# Patient Record
Sex: Male | Born: 1975 | Race: White | Hispanic: No | Smoking: Never smoker
Health system: Southern US, Community
[De-identification: ages and names within clinical notes are randomized; demographics above are authoritative.]

## PROBLEM LIST (undated history)

## (undated) DIAGNOSIS — I639 Cerebral infarction, unspecified: Secondary | ICD-10-CM

## (undated) DIAGNOSIS — Z8489 Family history of other specified conditions: Secondary | ICD-10-CM

## (undated) DIAGNOSIS — Z87442 Personal history of urinary calculi: Secondary | ICD-10-CM

## (undated) DIAGNOSIS — M1990 Unspecified osteoarthritis, unspecified site: Secondary | ICD-10-CM

## (undated) DIAGNOSIS — U071 COVID-19: Secondary | ICD-10-CM

## (undated) DIAGNOSIS — E785 Hyperlipidemia, unspecified: Secondary | ICD-10-CM

## (undated) DIAGNOSIS — I493 Ventricular premature depolarization: Secondary | ICD-10-CM

## (undated) DIAGNOSIS — I10 Essential (primary) hypertension: Secondary | ICD-10-CM

## (undated) HISTORY — PX: OTHER SURGICAL HISTORY: SHX169

## (undated) HISTORY — PX: TONSILLECTOMY: SUR1361

## (undated) HISTORY — PX: HERNIA REPAIR: SHX51

---

## 1999-03-19 DIAGNOSIS — J930 Spontaneous tension pneumothorax: Secondary | ICD-10-CM

## 1999-03-19 HISTORY — DX: Spontaneous tension pneumothorax: J93.0

## 2004-11-08 ENCOUNTER — Emergency Department: Payer: Self-pay | Admitting: Emergency Medicine

## 2006-09-13 ENCOUNTER — Ambulatory Visit: Payer: Self-pay | Admitting: Emergency Medicine

## 2007-06-10 ENCOUNTER — Other Ambulatory Visit: Payer: Self-pay

## 2007-06-10 ENCOUNTER — Observation Stay: Payer: Self-pay | Admitting: Internal Medicine

## 2007-06-10 ENCOUNTER — Ambulatory Visit: Payer: Self-pay | Admitting: Family Medicine

## 2007-08-06 ENCOUNTER — Ambulatory Visit: Payer: Self-pay | Admitting: Internal Medicine

## 2007-08-25 ENCOUNTER — Ambulatory Visit: Payer: Self-pay | Admitting: Internal Medicine

## 2008-01-24 ENCOUNTER — Emergency Department: Payer: Self-pay | Admitting: Emergency Medicine

## 2010-05-02 ENCOUNTER — Ambulatory Visit: Payer: Self-pay | Admitting: Family Medicine

## 2010-05-23 ENCOUNTER — Other Ambulatory Visit: Payer: Self-pay | Admitting: Neurology

## 2010-05-23 DIAGNOSIS — H547 Unspecified visual loss: Secondary | ICD-10-CM

## 2010-05-24 ENCOUNTER — Ambulatory Visit (HOSPITAL_COMMUNITY)
Admission: RE | Admit: 2010-05-24 | Discharge: 2010-05-24 | Disposition: A | Discharge status: Home / Self Care | Payer: BC Managed Care – PPO | Source: Ambulatory Visit | Attending: Neurology | Admitting: Neurology

## 2010-05-24 DIAGNOSIS — H53129 Transient visual loss, unspecified eye: Secondary | ICD-10-CM | POA: Insufficient documentation

## 2010-05-24 DIAGNOSIS — I10 Essential (primary) hypertension: Secondary | ICD-10-CM | POA: Insufficient documentation

## 2010-05-24 DIAGNOSIS — I517 Cardiomegaly: Secondary | ICD-10-CM

## 2010-05-24 DIAGNOSIS — I6350 Cerebral infarction due to unspecified occlusion or stenosis of unspecified cerebral artery: Secondary | ICD-10-CM | POA: Insufficient documentation

## 2010-05-24 DIAGNOSIS — H547 Unspecified visual loss: Secondary | ICD-10-CM

## 2012-07-06 DIAGNOSIS — Z720 Tobacco use: Secondary | ICD-10-CM | POA: Insufficient documentation

## 2012-07-06 DIAGNOSIS — E7800 Pure hypercholesterolemia, unspecified: Secondary | ICD-10-CM | POA: Insufficient documentation

## 2012-07-06 DIAGNOSIS — I10 Essential (primary) hypertension: Secondary | ICD-10-CM | POA: Insufficient documentation

## 2012-11-30 DIAGNOSIS — Z0189 Encounter for other specified special examinations: Secondary | ICD-10-CM | POA: Insufficient documentation

## 2012-11-30 DIAGNOSIS — I639 Cerebral infarction, unspecified: Secondary | ICD-10-CM | POA: Insufficient documentation

## 2014-08-10 ENCOUNTER — Other Ambulatory Visit: Payer: Self-pay

## 2014-08-10 ENCOUNTER — Emergency Department: Payer: BLUE CROSS/BLUE SHIELD

## 2014-08-10 ENCOUNTER — Emergency Department
Admission: EM | Admit: 2014-08-10 | Discharge: 2014-08-10 | Disposition: A | Discharge status: Home / Self Care | Payer: BLUE CROSS/BLUE SHIELD | Attending: Emergency Medicine | Admitting: Emergency Medicine

## 2014-08-10 DIAGNOSIS — R51 Headache: Secondary | ICD-10-CM | POA: Diagnosis not present

## 2014-08-10 DIAGNOSIS — Z79899 Other long term (current) drug therapy: Secondary | ICD-10-CM | POA: Insufficient documentation

## 2014-08-10 DIAGNOSIS — R42 Dizziness and giddiness: Secondary | ICD-10-CM | POA: Diagnosis present

## 2014-08-10 DIAGNOSIS — Z7982 Long term (current) use of aspirin: Secondary | ICD-10-CM | POA: Diagnosis not present

## 2014-08-10 DIAGNOSIS — R270 Ataxia, unspecified: Secondary | ICD-10-CM | POA: Diagnosis not present

## 2014-08-10 LAB — TROPONIN I

## 2014-08-10 LAB — BASIC METABOLIC PANEL
ANION GAP: 8 (ref 5–15)
BUN: 12 mg/dL (ref 6–20)
CALCIUM: 9.2 mg/dL (ref 8.9–10.3)
CO2: 29 mmol/L (ref 22–32)
Chloride: 106 mmol/L (ref 101–111)
Creatinine, Ser: 0.82 mg/dL (ref 0.61–1.24)
GFR calc Af Amer: >60 mL/min (ref >60)
GFR calc non Af Amer: >60 mL/min (ref >60)
GLUCOSE: 112 mg/dL — AB (ref 65–99)
Potassium: 3.7 mmol/L (ref 3.5–5.1)
Sodium: 143 mmol/L (ref 135–145)

## 2014-08-10 LAB — CBC
HEMATOCRIT: 42.8 % (ref 40.0–52.0)
Hemoglobin: 14.3 g/dL (ref 13.0–18.0)
MCH: 28.5 pg (ref 26.0–34.0)
MCHC: 33.3 g/dL (ref 32.0–36.0)
MCV: 85.4 fL (ref 80.0–100.0)
PLATELETS: 306 10*3/uL (ref 150–440)
RBC: 5.01 MIL/uL (ref 4.40–5.90)
RDW: 13.7 % (ref 11.5–14.5)
WBC: 11.2 10*3/uL — ABNORMAL HIGH (ref 3.8–10.6)

## 2014-08-10 NOTE — ED Notes (Signed)
Pt reports feeling "off balance" since Sunday. Pt reports Sunday was the worst of the dizziness but over the past few days it has been constant. Pt reports a "burning" headache last night while at work.

## 2014-08-10 NOTE — ED Provider Notes (Signed)
[facility] Emergency Department Provider Note     Time seen: ----------------------------------------- 11:29 AM on 08/10/2014 -----------------------------------------    I have reviewed the triage vital signs and the nursing notes.   HISTORY  Chief Complaint Dizziness    HPI Jerome [name] is a 39 y.o. male who presents ER after being sent here by her clinic for dizziness. Clinical clinic reports his blood pressure is been elevated.Patient reports that Sunday he started noticing some balance difficulty. Review get up and walk and maybe take 1 step to the left. States when he is up and walking he feels a little off balance. It's not as bad as it was Sunday, he's not had a history of this before in terms of balance disturbance. He said a stroke in the past, had a transient headache today but is gone now. With his previous stroke he never had knee ataxia or or balance trouble. He denies any other complaints denies any recent illness. Symptoms are mild at this time.     No past medical history on file.  There are no active problems to display for this patient.   No past surgical history on file.  Current Outpatient Rx  Name  Route  Sig  Dispense  Refill  . aspirin EC 81 MG tablet   Oral   Take 81 mg by mouth daily. At lunchtime         . atorvastatin (LIPITOR) 20 MG tablet   Oral   Take 20 mg by mouth daily. At lunchtime         . lisinopril (PRINIVIL,ZESTRIL) 10 MG tablet   Oral   Take 10 mg by mouth daily. At lunchtime         . metoprolol succinate (TOPROL-XL) 100 MG 24 hr tablet   Oral   Take 100 mg by mouth daily. At lunchtime           Allergies Review of patient's allergies indicates no known allergies.  No family history on file.  Social History History  Substance Use Topics  . Smoking status: Not on file  . Smokeless tobacco: Not on file  . Alcohol Use: Not on file    Review of Systems Constitutional: Negative  for fever. Eyes: Negative for visual changes. ENT: Negative for sore throat. Cardiovascular: Negative for chest pain. Respiratory: Negative for shortness of breath. Gastrointestinal: Negative for abdominal pain, vomiting and diarrhea. Genitourinary: Negative for dysuria. Musculoskeletal: Negative for back pain. Skin: Negative for rash. Neurological: Positive for balance disturbance  10-point ROS otherwise negative.  ____________________________________________   PHYSICAL EXAM:  VITAL SIGNS: ED Triage Vitals  Enc Vitals Group     BP 08/10/14 1031 164/111 mmHg     Pulse Rate 08/10/14 1031 72     Resp 08/10/14 1031 22     Temp 08/10/14 1031 97.9 F (36.6 C)     Temp src --      SpO2 08/10/14 1031 99 %     Weight 08/10/14 1031 241 lb (109.317 kg)     Height 08/10/14 1031 6\' 1"  (1.854 m)     Head Cir --      Peak Flow --      Pain Score --      Pain Loc --      Pain Edu? --      Excl. in GC? --     Constitutional: Alert and oriented. Well appearing and in no distress. Eyes: Conjunctivae are normal. PERRL. Normal extraocular movements.  ENT   Head: Normocephalic and atraumatic.   Nose: No congestion/rhinnorhea.   Mouth/Throat: Mucous membranes are moist.   Neck: No stridor. Hematological/Lymphatic/Immunilogical: No cervical lymphadenopathy. Cardiovascular: Normal rate, regular rhythm. Normal and symmetric distal pulses are present in all extremities. No murmurs, rubs, or gallops. Respiratory: Normal respiratory effort without tachypnea nor retractions. Breath sounds are clear and equal bilaterally. No wheezes/rales/rhonchi. Gastrointestinal: Soft and nontender. No distention. No abdominal bruits. There is no CVA tenderness. Musculoskeletal: Nontender with normal range of motion in all extremities. No joint effusions.  No lower extremity tenderness nor edema. Neurologic:  There is mild gait instability and mild positive Romberg, patient thought he was falling  forward. No numbness, no weakness, no speech or language dysfunction. Skin:  Skin is warm, dry and intact. No rash noted. Psychiatric: Mood and affect are normal. Speech and behavior are normal. Patient exhibits appropriate insight and judgment.  ____________________________________________    LABS (pertinent positives/negatives)  Labs Reviewed  CBC - Abnormal; Notable for the following:    WBC 11.2 (*)    All other components within normal limits  BASIC METABOLIC PANEL - Abnormal; Notable for the following:    Glucose, Bld 112 (*)    All other components within normal limits  TROPONIN I    EKG: Interpreted by me., Normal sinus rhythm rate 70, normal axis normal intervals no evidence of hypertrophy or acute infarction.  ____________________________________________  ED COURSE:  Pertinent labs & imaging results that were available during my care of the patient were reviewed by me and considered in my medical decision making (see chart for details).  Patient will need a repeat CT head and basic lab work ____________________________________________   RADIOLOGY  CT head  FINDINGS: Ventricle size normal. Negative for acute infarct. Negative for hemorrhage or mass. Benign cyst right basal ganglia stable.  Calvarium intact.  IMPRESSION: Negative ____________________________________________    FINAL ASSESSMENT AND PLAN  Ataxia   Plan: Symptoms have resolved this time, discussed with neurology specifically Dr. Loretha Brasil who agreed patient can follow-up as an outpatient, for carotid Dopplers and possibly MRI. He'll continue on aspirin 81 mg and return for worsening or worrisome symptoms.    Emily Filbert, MD   Emily Filbert, MD 08/10/14 [phone]

## 2014-08-10 NOTE — ED Notes (Signed)
[facility] here because he was dizzy and his balance hasnt been right. [facility] reports that he has elevated B/P

## 2014-08-10 NOTE — Discharge Instructions (Signed)

## 2014-11-23 ENCOUNTER — Other Ambulatory Visit: Payer: Self-pay | Admitting: Family Medicine

## 2014-11-24 ENCOUNTER — Other Ambulatory Visit: Payer: Self-pay | Admitting: Family Medicine

## 2014-11-24 DIAGNOSIS — G118 Other hereditary ataxias: Secondary | ICD-10-CM

## 2014-11-24 DIAGNOSIS — R42 Dizziness and giddiness: Secondary | ICD-10-CM

## 2014-11-30 ENCOUNTER — Ambulatory Visit
Admission: RE | Admit: 2014-11-30 | Discharge: 2014-11-30 | Disposition: A | Discharge status: Home / Self Care | Payer: BLUE CROSS/BLUE SHIELD | Source: Ambulatory Visit | Attending: Family Medicine | Admitting: Family Medicine

## 2014-11-30 DIAGNOSIS — R42 Dizziness and giddiness: Secondary | ICD-10-CM | POA: Insufficient documentation

## 2014-11-30 DIAGNOSIS — G118 Other hereditary ataxias: Secondary | ICD-10-CM | POA: Insufficient documentation

## 2014-11-30 MED ORDER — GADOBENATE DIMEGLUMINE 529 MG/ML IV SOLN
20.0000 mL | Freq: Once | INTRAVENOUS | Status: AC | PRN
  Administered 2014-11-30: 20 mL via INTRAVENOUS

## 2015-01-31 ENCOUNTER — Ambulatory Visit (INDEPENDENT_AMBULATORY_CARE_PROVIDER_SITE_OTHER): Payer: BLUE CROSS/BLUE SHIELD | Admitting: Sports Medicine

## 2015-01-31 ENCOUNTER — Ambulatory Visit (INDEPENDENT_AMBULATORY_CARE_PROVIDER_SITE_OTHER): Payer: BLUE CROSS/BLUE SHIELD

## 2015-01-31 DIAGNOSIS — M722 Plantar fascial fibromatosis: Secondary | ICD-10-CM | POA: Diagnosis not present

## 2015-01-31 DIAGNOSIS — M79671 Pain in right foot: Secondary | ICD-10-CM

## 2015-01-31 MED ORDER — TRIAMCINOLONE ACETONIDE 10 MG/ML IJ SUSP: 10.0000 mg | Freq: Once | INTRAMUSCULAR | Status: DC

## 2015-01-31 MED ORDER — MELOXICAM 15 MG PO TABS
15.0000 mg | ORAL_TABLET | Freq: Every day | ORAL | Status: DC
Start: 2015-01-31 — End: 2018-02-25

## 2015-01-31 NOTE — Progress Notes (Deleted)
   Subjective:    Patient ID: Jerome [name], male    DOB: 06-22-1975, 39 y.o.   MRN: [id number]  HPI    Review of Systems  All other systems reviewed and are negative.      Objective:   Physical Exam        Assessment & Plan:

## 2015-01-31 NOTE — Patient Instructions (Signed)

## 2015-01-31 NOTE — Progress Notes (Signed)
Patient ID: Jerome [name], male   DOB: 09/02/1975, 39 y.o.   MRN: [id number] Subjective: Jerome [name] is a 39 y.o. male patient presents to office with complaint of heel pain on the right. 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 Patient has treated this problem with changing shoes. Patient reports that this treatment has not worked. Patient denies any other pedal complaints. Admits to plantar fasciitis previously on left heel that is not currently bothersome.  There are no active problems to display for this patient.  Current Outpatient Prescriptions on File Prior to Visit  Medication Sig Dispense Refill  . aspirin EC 81 MG tablet Take 81 mg by mouth daily. At lunchtime    . atorvastatin (LIPITOR) 20 MG tablet Take 20 mg by mouth daily. At lunchtime    . lisinopril (PRINIVIL,ZESTRIL) 10 MG tablet Take 10 mg by mouth daily. At lunchtime    . metoprolol succinate (TOPROL-XL) 100 MG 24 hr tablet Take 100 mg by mouth daily. At lunchtime     No current facility-administered medications on file prior to visit.   Allergies  Allergen Reactions  . Simvastatin Rash    Elevated CPK Other reaction(s): Muscle Pain Elevated CPK    Objective: Physical Exam General: The patient is alert and oriented x3 in no acute distress.  Dermatology: Skin is warm, dry and supple bilateral lower extremities. Nails 1-10 are normal. There is no erythema, edema, no eccymosis, no open lesions present. Integument is otherwise unremarkable.  Vascular: Dorsalis Pedis pulse and Posterior Tibial pulse are 2/4 bilateral. Capillary fill time is immediate to all digits.  Neurological: Grossly intact to light touch with an achilles reflex of +2/5 and a  negative Tinel's sign bilateral.  Musculoskeletal: Tenderness to palpation at the medial calcaneal tubercale and through the insertion of the plantar fascia on the right foot. No pain with compression of calcaneus bilateral. No pain with  tuning fork to calcaneus bilateral. No pain with calf compression bilateral. All pedal and ankle joints range of motion within normal limits bilateral. Strength 5/5 in all groups bilateral.   Gait: Unassisted, Antalgic avoid weight on right heel  Xray, Right foot: 3 Views Normal osseous mineralization. Joint spaces preserved. No fracture/dislocation/boney destruction. Calcaneal spur present with mild thickening of plantar fascia. No other soft tissue abnormalities or radiopaque foreign bodies.   Assessment and Plan: Problem List Items Addressed This Visit    None    Visit Diagnoses    Plantar fasciitis of right foot    -  Primary    Relevant Medications    triamcinolone acetonide (KENALOG) 10 MG/ML injection 10 mg    meloxicam (MOBIC) 15 MG tablet    Right foot pain        Relevant Medications    triamcinolone acetonide (KENALOG) 10 MG/ML injection 10 mg    meloxicam (MOBIC) 15 MG tablet    Other Relevant Orders    DG Foot Complete Right       -Complete examination performed. Discussed with patient in detail the condition of plantar fasciitis, how this occurs and general treatment options. Explained both conservative and surgical treatments.  -After oral consent and aseptic prep, injected a mixture containing 1 ml of 2%  plain lidocaine, 1 ml 0.5% plain marcaine, 0.5 ml of kenalog 10 and 0.5 ml of dexamethasone phosphate into right heel. Post-injection care discussed with patient.  -Rx Meloxicam -Recommended good supportive shoes and advised use of OTC insert. Explained to patient that  if these orthoses work well, we will continue with these. If these do not improve his condition and  pain, we will consider custom molded orthoses. - Explained in detail the use of the fascial brace which was dispensed at today's visit. -Patient already has a night splint; Instructed to start using it again on the right foot daily.  -Explained and dispensed to patient daily stretching  exercises. -Recommend patient to ice affected area 1-2x daily. -Patient to return to office in 3-4 weeks for follow up or sooner if problems or questions  Arise.  Asencion Islam, DPM

## 2015-02-21 ENCOUNTER — Ambulatory Visit (INDEPENDENT_AMBULATORY_CARE_PROVIDER_SITE_OTHER): Payer: BLUE CROSS/BLUE SHIELD | Admitting: Sports Medicine

## 2015-02-21 ENCOUNTER — Encounter: Payer: Self-pay | Admitting: Sports Medicine

## 2015-02-21 DIAGNOSIS — M722 Plantar fascial fibromatosis: Secondary | ICD-10-CM

## 2015-02-21 DIAGNOSIS — M79671 Pain in right foot: Secondary | ICD-10-CM

## 2015-02-21 NOTE — Progress Notes (Signed)
Patient ID: Jerome [name], male   DOB: 08/29/1975, [age] y.o.   MRN: [id number] Subjective: Jerome [name] is a 39 y.o. male returns to office for follow up evaluation after Right heel injection for plantar fasciitis, injection #1 administered 3 weeks ago. Patient states that the injection seems to help his pain; pain feels completely gone; stopped using brace and night splint 1 week ago. Patient able to tolerate meloxicam without adverse reaction. Patient denies any recent changes in medications or new problems since last visit.   There are no active problems to display for this patient.  Current Outpatient Prescriptions on File Prior to Visit  Medication Sig Dispense Refill  . aspirin EC 81 MG tablet Take 81 mg by mouth daily. At lunchtime    . atorvastatin (LIPITOR) 20 MG tablet Take 20 mg by mouth daily. At lunchtime    . fluticasone (FLONASE) 50 MCG/ACT nasal spray Place 2 sprays into both nostrils daily.    [name]. lisinopril (PRINIVIL,ZESTRIL) 10 MG tablet Take 10 mg by mouth daily. At lunchtime    . meloxicam (MOBIC) 15 MG tablet Take 1 tablet (15 mg total) by mouth daily. 30 tablet 0  . metoprolol succinate (TOPROL-XL) 100 MG 24 hr tablet Take 100 mg by mouth daily. At lunchtime     Current Facility-Administered Medications on File Prior to Visit  Medication Dose Route Frequency Provider Last Rate Last Dose  . triamcinolone acetonide (KENALOG) 10 MG/ML injection 10 mg  10 mg Other Once Asencion Islamitorya Zhane Donlan, DPM       Allergies  Allergen Reactions  . Simvastatin Rash    Elevated CPK Other reaction(s): Muscle Pain Elevated CPK   Objective:   General:  Alert and oriented x 3, in no acute distress  Dermatology: Skin is warm, dry, and supple bilateral. Nails are within normal limits. There is no lower extremity erythema, no eccymosis, no open lesions present bilateral.   Vascular: Dorsalis Pedis and Posterior Tibial pedal pulses are 2/4 bilateral. + hair growth noted bilateral. Capillary Fill  Time is 3 seconds in all digits. No varicosities, No edema bilateral lower extremities.   Neurological: Sensation grossly intact to light touch with an achilles reflex of +2 and a  negative Tinel's sign bilateral. Vibratory, sharp/dull, Semmes Weinstein Monofilament within normal limits.   Musculoskeletal: There is no tenderness to palpation at the medial calcaneal tubercale and through the insertion of the plantar fascia on the right foot. No pain with compression to calcaneus or application of tuning fork. All Joints range of motion within normal limits bilateral. Strength 5/5 bilateral.   Assessment and Plan: Problem List Items Addressed This Visit    None    Visit Diagnoses    Right foot pain    -  Primary    improved    Plantar fasciitis of right foot        improved      -Complete examination performed.  -Discussed with patient in detail the condition of plantar fasciitis, how this  occurs related to the foot type of the patient and general treatment options. - Advised patient to continue with night splint for a few more weeks to prevent reocurence -Advised patient to complete course of Meloxicam -Continue with stretching, icing, good supportive shoes, inserts daily.  -Patient to return to office in 1 month for complete d/c of care/ follow up or sooner if problems or questions arise. May consider orthotics for long term management.   Asencion Islamitorya Shauntel Prest, DPM

## 2015-02-24 ENCOUNTER — Ambulatory Visit: Payer: BLUE CROSS/BLUE SHIELD | Admitting: Sports Medicine

## 2015-03-23 DIAGNOSIS — R519 Headache, unspecified: Secondary | ICD-10-CM | POA: Insufficient documentation

## 2015-03-24 ENCOUNTER — Ambulatory Visit (INDEPENDENT_AMBULATORY_CARE_PROVIDER_SITE_OTHER): Payer: BLUE CROSS/BLUE SHIELD | Admitting: Sports Medicine

## 2015-03-24 ENCOUNTER — Encounter: Payer: Self-pay | Admitting: Sports Medicine

## 2015-03-24 DIAGNOSIS — M722 Plantar fascial fibromatosis: Secondary | ICD-10-CM | POA: Diagnosis not present

## 2015-03-24 DIAGNOSIS — L309 Dermatitis, unspecified: Secondary | ICD-10-CM | POA: Diagnosis not present

## 2015-03-24 DIAGNOSIS — M79671 Pain in right foot: Secondary | ICD-10-CM | POA: Diagnosis not present

## 2015-03-24 MED ORDER — HYDROCORTISONE 2.5 % EX CREA: TOPICAL_CREAM | Freq: Two times a day (BID) | CUTANEOUS | Status: DC

## 2015-03-24 NOTE — Progress Notes (Signed)
Patient ID: Jerome [name], male   DOB: [birth year], 40 y.o.   MRN: [id number]  Subjective: Jerome [name] is a 40 y.o. male returns to office for follow up evaluation after Right heel injection for plantar fasciitis, injection #1 administered about 7 weeks ago. Patient states that he has no recurrence of pain. 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 Patient denies any recent changes in medications or any other new problems since last visit.   There are no active problems to display for this patient.  Current Outpatient Prescriptions on File Prior to Visit  Medication Sig Dispense Refill  . aspirin EC 81 MG tablet Take 81 mg by mouth daily. At lunchtime    . atorvastatin (LIPITOR) 20 MG tablet Take 20 mg by mouth daily. At lunchtime    . fluticasone (FLONASE) 50 MCG/ACT nasal spray Place 2 sprays into both nostrils daily.    Jerome [name]. lisinopril (PRINIVIL,ZESTRIL) 10 MG tablet Take 10 mg by mouth daily. At lunchtime    . meloxicam (MOBIC) 15 MG tablet Take 1 tablet (15 mg total) by mouth daily. 30 tablet 0  . metoprolol succinate (TOPROL-XL) 100 MG 24 hr tablet Take 100 mg by mouth daily. At lunchtime     Current Facility-Administered Medications on File Prior to Visit  Medication Dose Route Frequency Provider Last Rate Last Dose  . triamcinolone acetonide (KENALOG) 10 MG/ML injection 10 mg  10 mg Other Once Jerome Islamitorya Kush Farabee, DPM       Allergies  Allergen Reactions  . Simvastatin Rash    Elevated CPK Other reaction(s): Muscle Pain Elevated CPK   Objective:   General:  Alert and oriented x 3, in no acute distress  Dermatology: Skin is warm, dry, and supple bilateral. Nails are within normal limits. There is no lower extremity  eccymosis, no open lesions present bilateral however to dorsum of both feet there is a purritic  erythematous patch of skin consistent with dermatitis with no acute infection.  Vascular: Dorsalis Pedis and Posterior Tibial pedal pulses are 2/4 bilateral. + hair growth noted bilateral. Capillary Fill Time is 3 seconds in all digits. No varicosities, No edema bilateral lower extremities.   Neurological: Sensation grossly intact to light touch with an achilles reflex of +2 and a  negative Tinel's sign bilateral. Vibratory, sharp/dull, Semmes Weinstein Monofilament within normal limits.   Musculoskeletal: There is no tenderness to palpation at the medial calcaneal tubercale and through the insertion of the plantar fascia on the right foot. No pain with compression to calcaneus or application of tuning fork. All Joints range of motion within normal limits bilateral. Strength 5/5 bilateral.   Assessment and Plan: Problem List Items Addressed This Visit    None    Visit Diagnoses    Right foot pain    -  Primary    Improved    Plantar fasciitis of right foot        Resolved    Dermatitis        dorsum bil foot      -Complete examination performed.  -Rx hydrocortisone cream for dermatitis  -Discussed long term care and prevention for Plantar fasciitis. Continue with stretching, icing, good supportive shoes,OTC inserts daily. Will scan for custom orthotics at follow up visit to prevent recurrence.  -Patient to return to  office in 3-4 weeks for follow up or sooner if problems or questions arise.  Jerome [name], DPM

## 2015-04-21 ENCOUNTER — Ambulatory Visit: Payer: BLUE CROSS/BLUE SHIELD | Admitting: Sports Medicine

## 2017-06-13 DIAGNOSIS — E669 Obesity, unspecified: Secondary | ICD-10-CM | POA: Insufficient documentation

## 2017-06-13 DIAGNOSIS — M51369 Other intervertebral disc degeneration, lumbar region without mention of lumbar back pain or lower extremity pain: Secondary | ICD-10-CM | POA: Insufficient documentation

## 2017-06-13 DIAGNOSIS — M5136 Other intervertebral disc degeneration, lumbar region: Secondary | ICD-10-CM | POA: Insufficient documentation

## 2018-02-25 ENCOUNTER — Other Ambulatory Visit: Payer: Self-pay

## 2018-02-25 ENCOUNTER — Ambulatory Visit
Admission: EM | Admit: 2018-02-25 | Discharge: 2018-02-25 | Disposition: A | Discharge status: Home / Self Care | Payer: BLUE CROSS/BLUE SHIELD | Attending: Family Medicine | Admitting: Family Medicine

## 2018-02-25 ENCOUNTER — Encounter: Payer: Self-pay | Admitting: Emergency Medicine

## 2018-02-25 DIAGNOSIS — G8929 Other chronic pain: Secondary | ICD-10-CM

## 2018-02-25 DIAGNOSIS — M5441 Lumbago with sciatica, right side: Secondary | ICD-10-CM | POA: Diagnosis not present

## 2018-02-25 HISTORY — DX: Hyperlipidemia, unspecified: E78.5

## 2018-02-25 HISTORY — DX: Essential (primary) hypertension: I10

## 2018-02-25 MED ORDER — PREDNISONE 10 MG PO TABS: ORAL_TABLET | ORAL | 0 refills | Status: DC

## 2018-02-25 MED ORDER — METAXALONE 800 MG PO TABS: 800.0000 mg | ORAL_TABLET | Freq: Three times a day (TID) | ORAL | 0 refills | Status: DC | PRN

## 2018-02-25 NOTE — ED Provider Notes (Signed)
[facility]    CSN: [id number] Arrival date & time: 02/25/18  0843  History   Chief Complaint Chief Complaint  Patient presents with  . Back Pain    right side   HPI  42 year old male with chronic back pain presents with back pain.  Longstanding history of back pain.  Has lumbar degenerative disc.  Patient states that he had an acute exacerbation of his back pain started on Friday.  Occurred after he sneezed.  He has been seeing a chiropractor without resolution pain is located in the right low back.  Reports pain and numbness down his right lower extremity.  No incontinence.  Frustrated by activity.  No other factors.  No other associated symptoms.  PMH, Surgical Hx, Family Hx, Social History reviewed and updated as below.  PMH: HTN, Headache, DDD (lumbar), Obesity, HLD  Surgical Hx: TONSILLECTOMY      pneumothorax with chest tube 2001 2001  Spontaneous   REPAIR UMBILICAL HERNIA 08/22/2015 Abdomen/N/A Procedure: REPAIR UMBILICAL HERNIA, AGE 63 YEARS OR OLDER; REDUCIBLE; POSSIBLE MESH PLACEMENT; Surgeon: Satira AnisNoran Maged Barry, MD; Location: [facility] OR; Service: General Surgery; Laterality: N/A; Umbilical Hernia     Home Medications    Prior to Admission medications   Medication Sig Start Date End Date Taking? Authorizing Provider  amLODipine (NORVASC) 10 MG tablet Take by mouth. 10/28/17 10/28/18 Yes [provider]  aspirin EC 81 MG tablet Take 81 mg by mouth daily. At lunchtime   Yes [provider]  atorvastatin (LIPITOR) 20 MG tablet Take 20 mg by mouth daily. At lunchtime   Yes [provider]  ezetimibe (ZETIA) 10 MG tablet Take by mouth. 10/28/17  Yes [provider]  lisinopril-hydrochlorothiazide (PRINZIDE,ZESTORETIC) 20-12.5 MG tablet Take by mouth. 11/04/17 11/04/18 Yes [provider]  metoprolol succinate (TOPROL-XL) 100 MG 24 hr tablet Take 100 mg by mouth daily. At lunchtime   Yes [provider]    metaxalone (SKELAXIN) 800 MG tablet Take 1 tablet (800 mg total) by mouth 3 (three) times daily as needed. 02/25/18   Tommie Samsook, Leisl Spurrier G, DO  predniSONE (DELTASONE) 10 MG tablet 50 mg daily x 2 days, then 40 mg daily x 2 days, then 30 mg daily x 2 days, then 20 mg daily x 2 days, then 10 mg daily x 2 days. 02/25/18   Tommie Samsook, Joslyn Ramos G, DO    Family History Medical History Relation Name Comments  Asthma Brother    Coronary Artery Disease (Blocked arteries around heart) Father  Stents  High blood pressure (Hypertension) Father    Coronary Artery Disease (Blocked arteries around heart) Mother  CABG  High blood pressure (Hypertension) Mother    Asthma Son    Cancer Neg Hx     Social History Social History   Tobacco Use  . Smoking status: Never Smoker  . Smokeless tobacco: Never Used  Substance Use Topics  . Alcohol use: Yes    Alcohol/week: 0.0 standard drinks  . Drug use: Never     Allergies   Simvastatin   Review of Systems Review of Systems  Genitourinary: Negative.   Musculoskeletal: Positive for back pain.   Physical Exam Triage Vital Signs ED Triage Vitals  Enc Vitals Group     BP 02/25/18 0857 (S) (!) 177/102     Pulse Rate 02/25/18 0857 70     Resp 02/25/18 0857 16     Temp 02/25/18 0857 97.8 F (36.6 C)     Temp Source 02/25/18  0857 Oral     SpO2 02/25/18 0857 99 %     Weight 02/25/18 0854 255 lb (115.7 kg)     Height 02/25/18 0854 6\' 1"  (1.854 m)     Head Circumference --      Peak Flow --      Pain Score 02/25/18 0853 9     Pain Loc --      Pain Edu? --      Excl. in GC? --    Updated Vital Signs BP (S) (!) 177/102 (BP Location: Right Arm)   Pulse 70   Temp 97.8 F (36.6 C) (Oral)   Resp 16   Ht 6\' 1"  (1.854 m)   Wt 115.7 kg   SpO2 99%   BMI 33.64 kg/m   Visual Acuity Right Eye Distance:   Left Eye Distance:   Bilateral Distance:    Right Eye Near:   Left Eye Near:    Bilateral Near:     Physical Exam  Constitutional: He is  oriented to person, place, and time. He appears well-developed. No distress.  HENT:  Head: Normocephalic and atraumatic.  Eyes: Conjunctivae are normal. Right eye exhibits no discharge. Left eye exhibits no discharge.  Cardiovascular: Normal rate and regular rhythm.  Pulmonary/Chest: Effort normal. No respiratory distress.  Musculoskeletal:  Lumbar spine - paraspinal muscle tension/spasm noted on the right.   Neurological: He is alert and oriented to person, place, and time.  Psychiatric: He has a normal mood and affect. His behavior is normal.  Nursing note and vitals reviewed.  UC Treatments / Results  Labs (all labs ordered are listed, but only abnormal results are displayed) Labs Reviewed - No data to display  EKG None  Radiology No results found.  Procedures Procedures (including critical care time)  Medications Ordered in UC Medications - No data to display  Initial Impression / Assessment and Plan / UC Course  I have reviewed the triage vital signs and the nursing notes.  Pertinent labs & imaging results that were available during my care of the patient were reviewed by me and considered in my medical decision making (see chart for details).    42 year old male presents with acute on chronic back pain.  Treating with prednisone and Skelaxin.  Final Clinical Impressions(s) / UC Diagnoses   Final diagnoses:  Right-sided low back pain with right-sided sciatica, unspecified chronicity     Discharge Instructions     Medication as prescribed.  Take care  Dr. Adriana Simas    ED Prescriptions    Medication Sig Dispense Auth. Provider   predniSONE (DELTASONE) 10 MG tablet 50 mg daily x 2 days, then 40 mg daily x 2 days, then 30 mg daily x 2 days, then 20 mg daily x 2 days, then 10 mg daily x 2 days. 30 tablet Lailana Shira G, DO   metaxalone (SKELAXIN) 800 MG tablet Take 1 tablet (800 mg total) by mouth 3 (three) times daily as needed. 30 tablet Tommie Sams, DO      Controlled Substance Prescriptions [place] Controlled Substance Registry consulted? Not Applicable   Tommie Sams, [place] 02/25/18 1610

## 2018-02-25 NOTE — Discharge Instructions (Signed)
Medication as prescribed.  Take care  Dr. Madalena Kesecker  

## 2018-02-25 NOTE — ED Triage Notes (Signed)
Patient c/o right sided lower back pain that started Friday after he sneezed.  Patient states that he saw his chiropracter yesterday and has had no relief.

## 2018-03-02 ENCOUNTER — Other Ambulatory Visit: Payer: Self-pay

## 2018-03-02 ENCOUNTER — Encounter: Payer: Self-pay | Admitting: Emergency Medicine

## 2018-03-02 ENCOUNTER — Emergency Department
Admission: EM | Admit: 2018-03-02 | Discharge: 2018-03-02 | Disposition: A | Discharge status: Home / Self Care | Payer: BLUE CROSS/BLUE SHIELD | Attending: Emergency Medicine | Admitting: Emergency Medicine

## 2018-03-02 DIAGNOSIS — Z7982 Long term (current) use of aspirin: Secondary | ICD-10-CM | POA: Diagnosis not present

## 2018-03-02 DIAGNOSIS — I10 Essential (primary) hypertension: Secondary | ICD-10-CM | POA: Insufficient documentation

## 2018-03-02 DIAGNOSIS — M545 Low back pain: Secondary | ICD-10-CM | POA: Diagnosis present

## 2018-03-02 DIAGNOSIS — M5417 Radiculopathy, lumbosacral region: Secondary | ICD-10-CM | POA: Insufficient documentation

## 2018-03-02 DIAGNOSIS — Z79899 Other long term (current) drug therapy: Secondary | ICD-10-CM | POA: Diagnosis not present

## 2018-03-02 MED ORDER — HYDROCODONE-ACETAMINOPHEN 5-325 MG PO TABS: 1.0000 | ORAL_TABLET | Freq: Three times a day (TID) | ORAL | 0 refills | Status: AC | PRN

## 2018-03-02 MED ORDER — ORPHENADRINE CITRATE ER 100 MG PO TB12: 100.0000 mg | ORAL_TABLET | Freq: Two times a day (BID) | ORAL | 0 refills | Status: DC | PRN

## 2018-03-02 MED ORDER — KETOROLAC TROMETHAMINE 30 MG/ML IJ SOLN
60.0000 mg | Freq: Once | INTRAMUSCULAR | Status: AC
  Administered 2018-03-02: 60 mg via INTRAMUSCULAR
  Filled 2018-03-02: qty 2

## 2018-03-02 MED ORDER — ORPHENADRINE CITRATE 30 MG/ML IJ SOLN
60.0000 mg | INTRAMUSCULAR | Status: AC
  Administered 2018-03-02: 60 mg via INTRAMUSCULAR
  Filled 2018-03-02: qty 2

## 2018-03-02 NOTE — ED Provider Notes (Signed)
[name] Emergency Department Provider Note ____________________________________________  Time seen: 1040  I have reviewed the triage vital signs and the nursing notes.  HISTORY  Chief Complaint  Back Pain  HPI Ernestina PatchesKevin R [name] is a 42 y.o. male presents to the ED accompanied by his mother, for evaluation of acute on chronic low back pain.  Patient with a history of DDD presents now with acute pain is been present for about 2 weeks now.  Patient has been seen recently by the [facility] walk-in clinic, and was started on Skelaxin and prednisone.  He is on about day 6 of a 10-day prednisone taper.  He denies any significant benefit with the medications.  He is scheduled to see his previous Ortho provider, Dedra Skeensodd Mundy, PA-C on Wednesday.  He denies any bladder or bowel incontinence, foot drop, or saddle anesthesias.  He denies any preceding injury by slip, trip, or fall.  The onset of his pain began after a violent sneeze nearly 2 weeks earlier.  Past Medical History:  Diagnosis Date  . Hyperlipidemia   . Hypertension     There are no active problems to display for this patient.  History reviewed. No pertinent surgical history.  Prior to Admission medications   Medication Sig Start Date End Date Taking? Authorizing Provider  amLODipine (NORVASC) 10 MG tablet Take by mouth. 10/28/17 10/28/18  [provider]  aspirin EC 81 MG tablet Take 81 mg by mouth daily. At lunchtime    [provider]  atorvastatin (LIPITOR) 20 MG tablet Take 20 mg by mouth daily. At lunchtime    [provider]  ezetimibe (ZETIA) 10 MG tablet Take by mouth. 10/28/17   [provider]  HYDROcodone-acetaminophen (NORCO) 5-325 MG tablet Take 1 tablet by mouth 3 (three) times daily as needed for up to 3 days. 03/02/18 03/05/18  Markia Kyer, Charlesetta IvoryJenise V Bacon, PA-C  lisinopril-hydrochlorothiazide (PRINZIDE,ZESTORETIC) 20-12.5 MG tablet Take by mouth. 11/04/17 11/04/18  [provider]  metaxalone (SKELAXIN) 800 MG tablet Take 1 tablet (800 mg total) by mouth 3 (three) times daily as needed. 02/25/18   Tommie Samsook, Jayce G, DO  metoprolol succinate (TOPROL-XL) 100 MG 24 hr tablet Take 100 mg by mouth daily. At lunchtime    [provider]  orphenadrine (NORFLEX) 100 MG tablet Take 1 tablet (100 mg total) by mouth 2 (two) times daily as needed for muscle spasms. 03/02/18   Tyray Proch, Charlesetta IvoryJenise V Bacon, PA-C  predniSONE (DELTASONE) 10 MG tablet 50 mg daily x 2 days, then 40 mg daily x 2 days, then 30 mg daily x 2 days, then 20 mg daily x 2 days, then 10 mg daily x 2 days. 02/25/18   Tommie Samsook, Jayce G, DO    Allergies Simvastatin  Family History  Problem Relation Age of Onset  . Hypertension Mother   . Hypertension Father     Social History Social History   Tobacco Use  . Smoking status: Never Smoker  . Smokeless tobacco: Never Used  Substance Use Topics  . Alcohol use: Yes    Alcohol/week: 0.0 standard drinks  . Drug use: Never    Review of Systems  Constitutional: Negative for fever. Cardiovascular: Negative for chest pain. Respiratory: Negative for shortness of breath. Gastrointestinal: Negative for abdominal pain, vomiting and diarrhea. Genitourinary: Negative for dysuria. Musculoskeletal: Positive for back pain. Skin: Negative for rash. Neurological: Negative for headaches, focal weakness or numbness. ____________________________________________  PHYSICAL EXAM:  VITAL SIGNS: ED Triage Vitals  Enc Vitals  Group     BP 03/02/18 0952 (!) 191/103     Pulse Rate 03/02/18 0952 86     Resp 03/02/18 0952 16     Temp 03/02/18 0952 (!) 97.5 F (36.4 C)     Temp src --      SpO2 03/02/18 0952 100 %     Weight 03/02/18 0948 254 lb 13.6 oz (115.6 kg)     Height 03/02/18 0948 6\' 1"  (1.854 m)     Head Circumference --      Peak Flow --      Pain Score 03/02/18 0948 10     Pain Loc --      Pain Edu? --      Excl. in GC? --     Constitutional:  Alert and oriented. Well appearing and in no distress. Head: Normocephalic and atraumatic. Eyes: Conjunctivae are normal. Normal extraocular movements Cardiovascular: Normal rate, regular rhythm. Normal distal pulses. Respiratory: Normal respiratory effort. No wheezes/rales/rhonchi. Gastrointestinal: Soft and nontender. No distention. Musculoskeletal: Normal spinal alignment without midline tenderness, spasm, deformity, or step-off.  Patient with normal transition from sit to stand.  He is able demonstrate normal lumbar extension but decreased flexion range secondary to pain.  He localized tenderness to palpation to the right SI joint region.  There is also some discomfort to the distal calf.  Normal hip, knee, and ankle exam on the right.  Nontender with normal range of motion in all extremities.  Neurologic: Cranial nerves II through XII grossly intact.  Normal LE DTRs bilaterally.  Negative supine straight leg raise bilaterally.  Negative Trendelenburg.  Normal gait without ataxia. Normal speech and language. No gross focal neurologic deficits are appreciated. Skin:  Skin is warm, dry and intact. No rash noted. ____________________________________________   RADIOLOGY  Deferred ____________________________________________  PROCEDURES  Procedures Toradol 60 mg IM Norflex 60 mg IM ____________________________________________  INITIAL IMPRESSION / ASSESSMENT AND PLAN / ED COURSE  Patient with ED evaluation of acute on chronic low back pain.  Patient symptoms are assistant with an acute L5-S1 radiculopathy.  Patient reports significant improvement of his symptoms at the time of this disposition.  He rates the pain at a 4 out of 10 which is improved from a 10 out of 10 at onset.  He will be discharged with prescriptions for Norflex to take instead of the previously written Skelaxin.  He will continue with a prednisone taper, and follow-up with Dedra Skeens on Wednesday.  Patient is agreeable with  the plan and we deferred any imaging at this time to his Ortho provider.  Work note provided for 2 days as requested.  I reviewed the patient's prescription history over the last 12 months in the multi-state controlled substances database(s) that includes [place], [place], [place], [place], [place], [place], [place], [place], [place], [place], [place], [place], [place], and [place].  Results were notable for no current prescriptions. ____________________________________________  FINAL CLINICAL IMPRESSION(S) / ED DIAGNOSES  Final diagnoses:  Lumbosacral radiculopathy      Karmen Stabs, Charlesetta Ivory, PA-C 03/02/18 1807    Nita Sickle, MD 03/05/18 [phone]

## 2018-03-02 NOTE — ED Notes (Signed)
See triage note  Presents with pain to lower back which is moving into right leg.  States pain started after sneezing 1 1/2 weeks ago   States he has been seen by chiropractor and urgent care   Is currently taking steroids   Denies any urinary issues

## 2018-03-02 NOTE — ED Triage Notes (Signed)
Low back pain with leg numbness.  Unable to sit, so he does not want a wheelchair.

## 2018-03-02 NOTE — ED Triage Notes (Signed)
Right leg numbness.  Like knife in low back/hip

## 2018-03-02 NOTE — Discharge Instructions (Addendum)
Your symptoms are consistent with radicular pain from your lumbar spine. Take the previous steroid as prescribed. Take the muscle relaxant and pain medicine as needed. Follow-up with Jerome [name], as planned.

## 2018-03-04 ENCOUNTER — Other Ambulatory Visit: Payer: Self-pay | Admitting: Orthopedic Surgery

## 2018-03-04 DIAGNOSIS — M5417 Radiculopathy, lumbosacral region: Secondary | ICD-10-CM

## 2018-03-04 DIAGNOSIS — G8929 Other chronic pain: Secondary | ICD-10-CM

## 2018-03-04 DIAGNOSIS — M545 Low back pain, unspecified: Secondary | ICD-10-CM

## 2018-03-04 DIAGNOSIS — M51369 Other intervertebral disc degeneration, lumbar region without mention of lumbar back pain or lower extremity pain: Secondary | ICD-10-CM

## 2018-03-04 DIAGNOSIS — M5136 Other intervertebral disc degeneration, lumbar region: Secondary | ICD-10-CM

## 2018-03-04 DIAGNOSIS — M5441 Lumbago with sciatica, right side: Principal | ICD-10-CM

## 2018-03-04 DIAGNOSIS — M5440 Lumbago with sciatica, unspecified side: Secondary | ICD-10-CM

## 2018-03-22 ENCOUNTER — Ambulatory Visit
Admission: RE | Admit: 2018-03-22 | Discharge: 2018-03-22 | Disposition: A | Discharge status: Home / Self Care | Payer: BLUE CROSS/BLUE SHIELD | Source: Ambulatory Visit | Attending: Orthopedic Surgery | Admitting: Orthopedic Surgery

## 2018-03-22 DIAGNOSIS — M5440 Lumbago with sciatica, unspecified side: Secondary | ICD-10-CM | POA: Diagnosis present

## 2018-03-22 DIAGNOSIS — M5417 Radiculopathy, lumbosacral region: Secondary | ICD-10-CM | POA: Diagnosis present

## 2018-03-22 DIAGNOSIS — M5136 Other intervertebral disc degeneration, lumbar region: Secondary | ICD-10-CM | POA: Diagnosis present

## 2018-03-22 DIAGNOSIS — M5441 Lumbago with sciatica, right side: Secondary | ICD-10-CM | POA: Insufficient documentation

## 2018-03-22 DIAGNOSIS — M545 Low back pain, unspecified: Secondary | ICD-10-CM

## 2018-03-22 DIAGNOSIS — G8929 Other chronic pain: Secondary | ICD-10-CM | POA: Insufficient documentation

## 2018-10-22 ENCOUNTER — Emergency Department
Admission: EM | Admit: 2018-10-22 | Discharge: 2018-10-22 | Disposition: A | Discharge status: Home / Self Care | Payer: BC Managed Care – PPO | Attending: Emergency Medicine | Admitting: Emergency Medicine

## 2018-10-22 ENCOUNTER — Other Ambulatory Visit: Payer: Self-pay

## 2018-10-22 ENCOUNTER — Ambulatory Visit (INDEPENDENT_AMBULATORY_CARE_PROVIDER_SITE_OTHER)
Admission: EM | Admit: 2018-10-22 | Discharge: 2018-10-22 | Disposition: A | Discharge status: Nursing Home (Includes ICF) | Payer: BC Managed Care – PPO | Source: Home / Self Care

## 2018-10-22 ENCOUNTER — Emergency Department: Payer: BC Managed Care – PPO

## 2018-10-22 ENCOUNTER — Ambulatory Visit: Payer: Self-pay

## 2018-10-22 VITALS — BP 188/108 | HR 75 | Temp 97.8°F | Resp 18

## 2018-10-22 DIAGNOSIS — I10 Essential (primary) hypertension: Secondary | ICD-10-CM

## 2018-10-22 DIAGNOSIS — Z79899 Other long term (current) drug therapy: Secondary | ICD-10-CM | POA: Diagnosis not present

## 2018-10-22 DIAGNOSIS — R0602 Shortness of breath: Secondary | ICD-10-CM | POA: Diagnosis not present

## 2018-10-22 DIAGNOSIS — R002 Palpitations: Secondary | ICD-10-CM

## 2018-10-22 DIAGNOSIS — R9431 Abnormal electrocardiogram [ECG] [EKG]: Secondary | ICD-10-CM

## 2018-10-22 DIAGNOSIS — Z7982 Long term (current) use of aspirin: Secondary | ICD-10-CM | POA: Insufficient documentation

## 2018-10-22 LAB — COMPREHENSIVE METABOLIC PANEL WITH GFR
ALT: 54 U/L — ABNORMAL HIGH (ref 0–44)
AST: 33 U/L (ref 15–41)
Albumin: 4.1 g/dL (ref 3.5–5.0)
Alkaline Phosphatase: 68 U/L (ref 38–126)
Anion gap: 10 (ref 5–15)
BUN: 13 mg/dL (ref 6–20)
CO2: 24 mmol/L (ref 22–32)
Calcium: 8.8 mg/dL — ABNORMAL LOW (ref 8.9–10.3)
Chloride: 106 mmol/L (ref 98–111)
Creatinine, Ser: 0.65 mg/dL (ref 0.61–1.24)
GFR calc Af Amer: >60 mL/min
GFR calc non Af Amer: >60 mL/min
Glucose, Bld: 109 mg/dL — ABNORMAL HIGH (ref 70–99)
Potassium: 3.5 mmol/L (ref 3.5–5.1)
Sodium: 140 mmol/L (ref 135–145)
Total Bilirubin: 0.8 mg/dL (ref 0.3–1.2)
Total Protein: 7 g/dL (ref 6.5–8.1)

## 2018-10-22 LAB — TROPONIN I (HIGH SENSITIVITY)
Troponin I (High Sensitivity): 8 ng/L
Troponin I (High Sensitivity): 8 ng/L (ref <18)

## 2018-10-22 LAB — CBC
HCT: 43.3 % (ref 39.0–52.0)
Hemoglobin: 14.5 g/dL (ref 13.0–17.0)
MCH: 28.4 pg (ref 26.0–34.0)
MCHC: 33.5 g/dL (ref 30.0–36.0)
MCV: 84.7 fL (ref 80.0–100.0)
Platelets: 326 10*3/uL (ref 150–400)
RBC: 5.11 MIL/uL (ref 4.22–5.81)
RDW: 13.2 % (ref 11.5–15.5)
WBC: 10.3 10*3/uL (ref 4.0–10.5)
nRBC: 0 % (ref 0.0–0.2)

## 2018-10-22 MED ORDER — LISINOPRIL-HYDROCHLOROTHIAZIDE 20-12.5 MG PO TABS: 2.0000 | ORAL_TABLET | Freq: Every day | ORAL | 0 refills | Status: DC

## 2018-10-22 NOTE — ED Provider Notes (Signed)
[facility] Emergency Department Provider Note  ____________________________________________  Time seen: Approximately 2:57 PM  I have reviewed the triage vital signs and the nursing notes.   HISTORY  Chief Complaint Palpitations and Hypertension    HPI Jerome [name] is a 43 y.o. male with a history of hyperlipidemia and hypertension who comes to the ED for evaluation of palpitations and high blood pressure.   The palpitations last for a second or 2, have occurred about 3 or 4 distinct times today.  No aggravating or alleviating factors, not exertional.  Denies chest pain shortness of breath diaphoresis vomiting or radiation.  No orthostatic symptoms.  No dizziness or syncope.  Compliant with all his medications including lisinopril hydrochlorothiazide amlodipine and metoprolol for high blood pressure.  Review of electronic medical record shows patient was seen in urgent care earlier today, advised to come to the ED for evaluation due to abnormal appearance of his EKG at that time.   Past Medical History:  Diagnosis Date  . Hyperlipidemia   . Hypertension      There are no active problems to display for this patient.    History reviewed. No pertinent surgical history.   Prior to Admission medications   Medication Sig Start Date End Date Taking? Authorizing Provider  amLODipine (NORVASC) 10 MG tablet Take by mouth. 10/28/17 10/28/18  [provider]  aspirin EC 81 MG tablet Take 81 mg by mouth daily. At lunchtime    [provider]  atorvastatin (LIPITOR) 20 MG tablet Take 20 mg by mouth daily. At lunchtime    [provider]  ezetimibe (ZETIA) 10 MG tablet Take by mouth. 10/28/17   [provider]  lisinopril-hydrochlorothiazide (ZESTORETIC) 20-12.5 MG tablet Take 2 tablets by mouth daily. 10/22/18 10/22/19  Sharman CheekStafford, Jazleen Robeck, MD  metaxalone (SKELAXIN) 800 MG tablet Take 1 tablet (800 mg total) by mouth 3 (three) times  daily as needed. 02/25/18   Tommie Samsook, Jayce G, DO  metoprolol succinate (TOPROL-XL) 100 MG 24 hr tablet Take 100 mg by mouth daily. At lunchtime    [provider]  orphenadrine (NORFLEX) 100 MG tablet Take 1 tablet (100 mg total) by mouth 2 (two) times daily as needed for muscle spasms. 03/02/18   Menshew, Charlesetta IvoryJenise V Bacon, PA-C  predniSONE (DELTASONE) 10 MG tablet 50 mg daily x 2 days, then 40 mg daily x 2 days, then 30 mg daily x 2 days, then 20 mg daily x 2 days, then 10 mg daily x 2 days. 02/25/18   Tommie Samsook, Jayce G, DO     Allergies Simvastatin   Family History  Problem Relation Age of Onset  . Hypertension Mother   . Hypertension Father     Social History Social History   Tobacco Use  . Smoking status: Never Smoker  . Smokeless tobacco: Never Used  Substance Use Topics  . Alcohol use: Yes    Alcohol/week: 0.0 standard drinks  . Drug use: Never    Review of Systems  Constitutional:   No fever or chills.  ENT:   No sore throat. No rhinorrhea. Cardiovascular:   No chest pain or syncope. Respiratory:   No dyspnea or cough. Gastrointestinal:   Negative for abdominal pain, vomiting and diarrhea.  Musculoskeletal:   Negative for focal pain or swelling All other systems reviewed and are negative except as documented above in ROS and HPI.  ____________________________________________   PHYSICAL EXAM:  VITAL SIGNS: ED Triage Vitals  Enc Vitals Group  BP 10/22/18 1010 (!) 153/95     Pulse Rate 10/22/18 1010 60     Resp 10/22/18 1010 16     Temp 10/22/18 1010 98.8 F (37.1 C)     Temp Source 10/22/18 1010 Oral     SpO2 10/22/18 1010 100 %     Weight 10/22/18 1011 263 lb 6.4 oz (119.5 kg)     Height 10/22/18 1011 6\' 1"  (1.854 m)     Head Circumference --      Peak Flow --      Pain Score 10/22/18 1011 0     Pain Loc --      Pain Edu? --      Excl. in GC? --     Vital signs reviewed, nursing assessments reviewed.   Constitutional:   Alert and oriented.  Non-toxic appearance. Eyes:   Conjunctivae are normal. EOMI. PERRL. ENT      Head:   Normocephalic and atraumatic.      Nose:   No congestion/rhinnorhea.       Mouth/Throat:   MMM, no pharyngeal erythema. No peritonsillar mass.       Neck:   No meningismus. Full ROM. Hematological/Lymphatic/Immunilogical:   No cervical lymphadenopathy. Cardiovascular:   RRR. Symmetric bilateral radial and DP pulses.  No murmurs. Cap refill less than 2 seconds. Respiratory:   Normal respiratory effort without tachypnea/retractions. Breath sounds are clear and equal bilaterally. No wheezes/rales/rhonchi. Gastrointestinal:   Soft and nontender. Non distended. There is no CVA tenderness.  No rebound, rigidity, or guarding.  Musculoskeletal:   Normal range of motion in all extremities. No joint effusions.  No lower extremity tenderness.  No edema. Neurologic:   Normal speech and language.  Motor grossly intact. No acute focal neurologic deficits are appreciated.  Skin:    Skin is warm, dry and intact. No rash noted.  No petechiae, purpura, or bullae.  ____________________________________________    LABS (pertinent positives/negatives) (all labs ordered are listed, but only abnormal results are displayed) Labs Reviewed  COMPREHENSIVE METABOLIC PANEL - Abnormal; Notable for the following components:      Result Value   Glucose, Bld 109 (*)    Calcium 8.8 (*)    ALT 54 (*)    All other components within normal limits  CBC  TROPONIN I (HIGH SENSITIVITY)  TROPONIN I (HIGH SENSITIVITY)   ____________________________________________   EKG  Interpreted by me Normal sinus rhythm rate of 60, normal axis and intervals.  Normal QRS ST segments and T waves.  Grossly unchanged compared to previous.  ____________________________________________    RADIOLOGY  Dg Chest Portable 1 View  Result Date: 10/22/2018 CLINICAL DATA:  Chest palpitations and sensation of heart flutter for 3 days. EXAM: PORTABLE CHEST  1 VIEW COMPARISON:  None. FINDINGS: Lungs are clear. Heart size is normal. No pneumothorax or pleural fluid. No acute or focal bony abnormality. IMPRESSION: Negative chest. Electronically Signed   By: Drusilla Kannerhomas  Dalessio M.D.   On: 10/22/2018 11:49    ____________________________________________   PROCEDURES Procedures  ____________________________________________  DIFFERENTIAL DIAGNOSIS   Idiopathic palpitations, essential hypertension, non-STEMI.  Doubt hyperthyroidism, endocrine neoplasia, drug abuse, unstable angina, dissection, PE, carditis  CLINICAL IMPRESSION / ASSESSMENT AND PLAN / ED COURSE  Medications ordered in the ED: Medications - No data to display  Pertinent labs & imaging results that were available during my care of the patient were reviewed by me and considered in my medical decision making (see chart for details).  Jerome [name] was  evaluated in Emergency Department on 10/22/2018 for the symptoms described in the history of present illness. He was evaluated in the context of the global COVID-19 pandemic, which necessitated consideration that the patient might be at risk for infection with the SARS-CoV-2 virus that causes COVID-19. Institutional protocols and algorithms that pertain to the evaluation of patients at risk for COVID-19 are in a state of rapid change based on information released by regulatory bodies including the CDC and federal and state organizations. These policies and algorithms were followed during the patient's care in the ED.   Patient presents with palpitations and elevated blood pressure.  No chest pain, no shortness of breath, no anginal symptoms.  Calm and nontoxic in appearance.  EKG is un-concerning at this time.  Check labs and chest x-ray  Clinical Course as of Oct 22 1455  Thu Oct 22, 2018  1416 Serial troponins normal, delta negative.  Stable for discharge home, outpatient follow-up with cardiology for palpitations.  Continue metoprolol.    [PS]    Clinical Course User Index [PS] Carrie Mew, MD     ----------------------------------------- 3:00 PM on 10/22/2018 -----------------------------------------  He is already on high-dose metoprolol and amlodipine.  I will double his lisinopril/HCTZ combination and have him follow-up with primary care next week.  ____________________________________________   FINAL CLINICAL IMPRESSION(S) / ED DIAGNOSES    Final diagnoses:  Palpitations  Hypertension, unspecified type     ED Discharge Orders         Ordered    lisinopril-hydrochlorothiazide (ZESTORETIC) 20-12.5 MG tablet  Daily     10/22/18 1457          Portions of this note were generated with dragon dictation software. Dictation errors may occur despite best attempts at proofreading.   Carrie Mew, MD 10/22/18 1500

## 2018-10-22 NOTE — Progress Notes (Signed)
   Subjective:    Patient ID: Jerome [name], male    DOB: 02/13/1976, 43 y.o.   MRN: [id number]  Patient presented to clinic with requested to check BP. Patient stated that for the last 4 days he has had heart palpitations several times per day. He describes them as "taking his breath away". Patient to go to [facility] for further work up.     Review of Systems  All other systems reviewed and are negative.      Objective:   Physical Exam Vitals signs and nursing note reviewed.     Assessment & Plan:   Wt Readings from Last 3 Encounters:  03/02/18 254 lb 13.6 oz (115.6 kg)  02/25/18 255 lb (115.7 kg)  08/10/14 241 lb (109.3 kg)   Temp Readings from Last 3 Encounters:  10/22/18 97.8 F (36.6 C) (Temporal)  03/02/18 (!) 97.5 F (36.4 C)  02/25/18 97.8 F (36.6 C) (Oral)   BP Readings from Last 3 Encounters:  10/22/18 (!) 188/108  03/02/18 (!) 191/103  02/25/18 (S) (!) 177/102   Pulse Readings from Last 3 Encounters:  10/22/18 75  03/02/18 86  02/25/18 70     No results found for: POCGLU         Thank you!!  [facility] Nurse Specialist [place]: [phone]  Cell:  [phone] Website: [email or web address]

## 2018-10-22 NOTE — Patient Instructions (Signed)

## 2018-10-22 NOTE — ED Triage Notes (Signed)
Pt here for high bp that he noticed last night. Went to the nurse at work and also got elevated readings. Never had a history of this before. No other complaints but a "flutter in his chest" that has been happening for the past 3 days that comes and go. Is currently on bp medication.

## 2018-10-22 NOTE — Discharge Instructions (Signed)
You are going to need to be worked up further in the emergency room for you symptoms and EKG changes. Please leave urgent care and go directly to [facility] ER.   Honor Loh, MSN, APRN, FNP-C, CEN Advanced Practice Provider [facility] 10/22/2018 9:37 AM

## 2018-10-22 NOTE — ED Notes (Signed)
Report received. Pt from triage - came in with palpitations and htn. Pt on monitor at this time.

## 2018-10-22 NOTE — ED Provider Notes (Signed)
[place], [place]   Name: Jerome PatchesKevin R Ducre DOB: 02/22/1976 MRN: [id number] CSN: [id number] PCP: Titus MouldWhite, Elizabeth Burney, NP  Arrival date and time:  10/22/18 0856  Chief Complaint:  Hypertension   NOTE: Prior to seeing the patient today, I have reviewed the triage nursing documentation and vital signs. Clinical staff has updated patient's PMH/PSHx, current medication list, and drug allergies/intolerances to ensure comprehensive history available to assist in medical decision making.   History:   HPI: Jerome [name] is a 43 y.o. male who presents today with complaints of elevated blood pressure, episodes of shortness of breath, and the sensation of a "flutter" in his chest. Patient denies chest pain, however states that things "feel funny" inside of his chest. He advises that episodes have been increasing in frequency since their onset, but only last for a brief period before resolving. Patient denies use of any energy drinks/medications, illegal drugs, or excessive caffeine use. He denies associated nausea, vomiting, and diaphoresis. Patient presented to his occupational health nurse this morning and was sent to urgent care for elevated blood pressure readings (see [facility] occupational note).   Past Medical History:  Diagnosis Date  . Hyperlipidemia   . Hypertension     History reviewed. No pertinent surgical history.  Family History  Problem Relation Age of Onset  . Hypertension Mother   . Hypertension Father     Social History   Tobacco Use  . Smoking status: Never Smoker  . Smokeless tobacco: Never Used  Substance Use Topics  . Alcohol use: Yes    Alcohol/week: 0.0 standard drinks  . Drug use: Never    There are no active problems to display for this patient.   Home Medications:    Current Facility-Administered Medications for the 10/22/18 encounter [facility](Hospital Encounter)  Medication  . triamcinolone acetonide (KENALOG) 10 MG/ML injection 10 mg   Current Meds  Medication  Sig  . amLODipine (NORVASC) 10 MG tablet Take by mouth.  [name]. aspirin EC 81 MG tablet Take 81 mg by mouth daily. At lunchtime  . metoprolol succinate (TOPROL-XL) 100 MG 24 hr tablet Take 100 mg by mouth daily. At lunchtime  . [DISCONTINUED] lisinopril-hydrochlorothiazide (PRINZIDE,ZESTORETIC) 20-12.5 MG tablet Take by mouth.    Allergies:   Simvastatin  Review of Systems (ROS): Review of Systems  Constitutional: Negative for chills and fever.  Respiratory: Negative for cough and shortness of breath.   Cardiovascular: Positive for palpitations. Negative for chest pain.  Gastrointestinal: Negative for nausea and vomiting.  Musculoskeletal: Negative for back pain and neck pain.  Skin: Negative for color change, pallor and rash.  Neurological: Negative for dizziness, syncope, weakness and headaches.  All other systems reviewed and are negative.    Vital Signs: Today's Vitals   10/22/18 0909 10/22/18 0910 10/22/18 0942  BP: (!) 159/101    Pulse: 67    Resp: 18    Temp: 98.4 F (36.9 C)    TempSrc: Oral    SpO2: 100%    Weight:  263 lb (119.3 kg)   PainSc:  0-No pain 0-No pain    Physical Exam: Physical Exam  Constitutional: He is oriented to person, place, and time and well-developed, well-nourished, and in no distress. No distress.  HENT:  Head: Normocephalic and atraumatic.  Nose: Nose normal.  Mouth/Throat: Oropharynx is clear and moist.  Eyes: Pupils are equal, round, and reactive to light. Conjunctivae and EOM are normal.  Neck: Normal range of motion. Neck supple. No tracheal deviation present.  Cardiovascular:  Normal rate, regular rhythm, normal heart sounds and intact distal pulses. Exam reveals no gallop and no friction rub.  No murmur heard. Pulmonary/Chest: Effort normal and breath sounds normal. No respiratory distress. He has no wheezes. He has no rales.  Abdominal: Soft. Bowel sounds are normal. He exhibits no distension. There is no abdominal tenderness.   Musculoskeletal: Normal range of motion.  Lymphadenopathy:    He has no cervical adenopathy.  Neurological: He is alert and oriented to person, place, and time. Gait normal. GCS score is 15.  Skin: Skin is warm and dry. No rash noted. He is not diaphoretic.  Psychiatric: Mood, memory, affect and judgment normal.    Urgent Care Treatments / Results:   LABS: PLEASE NOTE: all labs that were ordered this encounter are listed, however only abnormal results are displayed. Labs Reviewed - No data to display  URGENT CARE ECG REPORT Date: 10/22/2018 Time ECG obtained: 0921 Rate: 65 bpm Rhythm: NSR Axis (leads I and aVF): normal Intervals: normal ST segment and T wave changes: No evidence of ST segment elevation. T waves in V4-V6 appear to be more inverted as compared to previous.  Comparison: T wave inversion when compared to 08/11/2014 tracing   RADIOLOGY: Dg Chest Portable 1 View  Result Date: 10/22/2018 CLINICAL DATA:  Chest palpitations and sensation of heart flutter for 3 days. EXAM: PORTABLE CHEST 1 VIEW COMPARISON:  None. FINDINGS: Lungs are clear. Heart size is normal. No pneumothorax or pleural fluid. No acute or focal bony abnormality. IMPRESSION: Negative chest. Electronically Signed   By: Drusilla Kannerhomas  Dalessio M.D.   On: 10/22/2018 11:49    PROCEDURES: Procedures  MEDICATIONS RECEIVED THIS VISIT: Medications - No data to display  PERTINENT CLINICAL COURSE NOTES/UPDATES: Clinical Course as of Oct 21 2052  Thu Oct 22, 2018  [id number] Patient with EKG changes that will necessitate further evaluation in the ED. Report called to charge nurse Lajuana Ripple(Moyer, RN).   [BG]    Clinical Course User Index [BG] Verlee MonteGray, Cara Aguino E, NP   Initial Impression / Assessment and Plan / Urgent Care Course:  Pertinent labs & imaging results that were available during my care of the patient were personally reviewed by me and considered in my medical decision making (see lab/imaging section of note for values and  interpretations).  Jerome [name] is a 43 y.o. male who [facility] with complaints of elevated blood pressure and palpitations.   Patient is well appearing overall in clinic today. He does not appear to be in any acute distress. Presenting symptoms (see HPI) and exam as documented above. EKG demonstrates precordial lead T waves that appear to be more inverted as compared to previous tracing done back in 2016. Patient is having palpitations and his blood pressure has been elevated over the last few days. He adamantly denies having any pain in his chest, shoulder, LUE, neck, or jaw. He confirms brief periods of shortness of breath that is experienced simultaneously with the palpitations. Etiology of symptoms unknown as patient denies any new provoking factors such as stimulant use or increased anxiety. Patient reports that he has been compliant with his antihypertensive regimen as prescribed by his PCP. Doubt ACS at this point, however given his presenting symptoms, coupled with the subtle changes noted on his EKG, patient needs further evaluation in the emergency department. Discussed need for further cardiac workup, which will at minimum include continuous cardiac monitoring and review of serial (timed) cardiac enzymes. Patient verbalizes understanding of  plan as discussed; reassurance provided. Report called to [facility] (see clinical notes). Patient wishes to go to the hospital via POV driven by a friend.  Final Clinical Impressions / Urgent Care Diagnoses:   Final diagnoses:  Hypertension, unspecified type  SOB (shortness of breath)  Palpitations  Nonspecific abnormal electrocardiogram (ECG) (EKG)    New Prescriptions:  [place] Controlled Substance Registry consulted? Not Applicable  No orders of the defined types were placed in this encounter.   Recommended Follow up Care:  Patient encouraged to follow up with the following provider within the specified time frame, or  sooner as dictated by the severity of his symptoms. As always, he was instructed that for any urgent/emergent care needs, he should seek care either here or in the emergency department for more immediate evaluation.  Follow-up Information    Go to  [place].   Specialty: Emergency Medicine Contact information: [place] [phone] [address] [place] [phone]        NOTE: This note was prepared using Dragon dictation software along with smaller phrase technology. Despite my best ability to proofread, there is the potential that transcriptional errors may still occur from this process, and are completely unintentional.    Karen Kitchens, NP 10/22/18 2054

## 2018-10-22 NOTE — Discharge Instructions (Signed)
Your chest x-ray EKG and blood test today were all unremarkable.  Please follow-up with your primary care doctor next week for reassessment of your symptoms and blood pressure.  Increase your lisinopril prescription as we discussed.

## 2018-10-22 NOTE — ED Triage Notes (Signed)
Pt here for heart palpitations X3 and elevated BP yesterday. Denies pain. Denies prior sensation of palpitations before. Pt alert and oriented X4, active, cooperative, pt in NAD. RR even and unlabored, color WNL.

## 2018-10-30 DIAGNOSIS — R0602 Shortness of breath: Secondary | ICD-10-CM | POA: Insufficient documentation

## 2018-10-30 DIAGNOSIS — R002 Palpitations: Secondary | ICD-10-CM | POA: Insufficient documentation

## 2018-10-30 DIAGNOSIS — R9431 Abnormal electrocardiogram [ECG] [EKG]: Secondary | ICD-10-CM | POA: Insufficient documentation

## 2018-12-02 ENCOUNTER — Ambulatory Visit: Payer: Self-pay

## 2018-12-02 ENCOUNTER — Other Ambulatory Visit: Payer: Self-pay

## 2018-12-02 DIAGNOSIS — Z23 Encounter for immunization: Secondary | ICD-10-CM

## 2018-12-02 NOTE — Progress Notes (Signed)
     Patient ID: Jerome [name], male    DOB: 1976/02/18, 43 y.o.   MRN: [id number]    Thank you!!  Apolonio Schneiders RN  [place] Nurse Specialist [place]: [phone]  Cell:  [phone] Website: [email or web address]

## 2018-12-02 NOTE — Patient Instructions (Signed)
Preventing Influenza, Adult Influenza, more commonly known as "the flu," is a viral infection that mainly affects the respiratory tract. The respiratory tract includes structures that help you breathe, such as the lungs, nose, and throat. The flu causes many common cold symptoms, as well as a high fever and body aches. The flu spreads easily from person to person (is contagious). The flu is most common from December through March. This is called flu season.You can catch the flu virus by:  Breathing in droplets from an infected person's cough or sneeze.  Touching something that was recently contaminated with the virus and then touching your mouth, nose, or eyes. What can I do to lower my risk?        You can decrease your risk of getting the flu by:  Getting a flu shot (influenza vaccination) every year. This is the best way to prevent the flu. A flu shot is recommended for everyone age 6 months and older. ? It is best to get a flu shot in the fall, as soon as it is available. Getting a flu shot during winter or spring instead is still a good idea. Flu season can last into early spring. ? Preventing the flu through vaccination requires getting a new flu shot every year. This is because the flu virus changes slightly (mutates) from one year to the next. Even if a flu shot does not completely protect you from all flu virus mutations, it can reduce the severity of your illness and prevent dangerous complications of the flu. ? If you are pregnant, you can and should get a flu shot. ? If you have had a reaction to the shot in the past or if you are allergic to eggs, check with your health care provider before getting a flu shot. ? Sometimes the vaccine is available as a nasal spray. In some years, the nasal spray has not been as effective against the flu virus. Check with your health care provider if you have questions about this.  Practicing good health habits. This is especially important during  flu season. ? Avoid contact with people who are sick with flu or cold symptoms. ? Wash your hands with soap and water often. If soap and water are not available, use alcohol-based hand sanitizer. ? Avoid touching your hands to your face, especially when you have not washed your hands recently. ? Use a disinfectant to clean surfaces at home and at work that may be contaminated with the flu virus. ? Keep your body's disease-fighting system (immune system) in good shape by eating a healthy diet, drinking plenty of fluids, getting enough sleep, and exercising regularly. If you do get the flu, avoid spreading it to others by:  Staying home until your symptoms have been gone for at least one day.  Covering your mouth and nose when you cough or sneeze.  Avoiding close contact with others, especially babies and elderly people. Why are these changes important? Getting a flu shot and practicing good health habits protects you as well as other people. If you get the flu, your friends, family, and co-workers are also at risk of getting it, because it spreads so easily to others. Each year, about 2 out of every 10 people get the flu. Having the flu can lead to complications, such as pneumonia, ear infection, and sinus infection. The flu also can be deadly, especially for babies, people older than age 65, and people who have serious long-term diseases. How is this treated? Most   people recover from the flu by resting at home and drinking plenty of fluids. However, a prescription antiviral medicine may reduce your flu symptoms and may make your flu go away sooner. This medicine must be started within a few days of getting flu symptoms. You can talk with your health care provider about whether you need an antiviral medicine. Antiviral medicine may be prescribed for people who are at risk for more serious flu symptoms. This includes people who:  Are older than age 65.  Are pregnant.  Have a condition that  makes the flu worse or more dangerous. Where to find more information  Centers for Disease Control and Prevention: www.cdc.gov/flu/index.htm  Flu.gov: www.flu.gov/prevention-vaccination  American Academy of Family Physicians: familydoctor.org/familydoctor/en/kids/vaccines/preventing-the-flu.html Contact a health care provider if:  You have influenza and you develop new symptoms.  You have: ? Chest pain. ? Diarrhea. ? A fever.  Your cough gets worse, or you produce more mucus. Summary  The best way to prevent the flu is to get a flu shot every year in the fall.  Even if you get the flu after you have received the yearly vaccine, your flu may be milder and go away sooner because of your flu shot.  If you get the flu, antiviral medicines that are started with a few days of symptoms may reduce your flu symptoms and may make your flu go away sooner.  You can also help prevent the flu by practicing good health habits. This information is not intended to replace advice given to you by your health care provider. Make sure you discuss any questions you have with your health care provider. Document Released: 03/19/2015 Document Revised: 02/14/2017 Document Reviewed: 11/11/2015 Elsevier Patient Education  2020 Elsevier Inc. Influenza (Flu) Vaccine (Inactivated or Recombinant): What You Need to Know 1. Why get vaccinated? Influenza vaccine can prevent influenza (flu). Flu is a contagious disease that spreads around the [place] every year, usually between October and May. Anyone can get the flu, but it is more dangerous for some people. Infants and young children, people 65 years of age and older, pregnant women, and people with certain health conditions or a weakened immune system are at greatest risk of flu complications. Pneumonia, bronchitis, sinus infections and ear infections are examples of flu-related complications. If you have a medical condition, such as heart disease, cancer or  diabetes, flu can make it worse. Flu can cause fever and chills, sore throat, muscle aches, fatigue, cough, headache, and runny or stuffy nose. Some people may have vomiting and diarrhea, though this is more common in children than adults. Each year thousands of people in the [place] die from flu, and many more are hospitalized. Flu vaccine prevents millions of illnesses and flu-related visits to the doctor each year. 2. Influenza vaccine CDC recommends everyone 6 months of age and older get vaccinated every flu season. Children 6 months through 8 years of age may need 2 doses during a single flu season. Everyone else needs only 1 dose each flu season. It takes about 2 weeks for protection to develop after vaccination. There are many flu viruses, and they are always changing. Each year a new flu vaccine is made to protect against three or four viruses that are likely to cause disease in the upcoming flu season. Even when the vaccine doesn't exactly match these viruses, it may still provide some protection. Influenza vaccine does not cause flu. Influenza vaccine may be given at the same time as other vaccines. 3.   Talk with your health care provider Tell your vaccine provider if the person getting the vaccine:  Has had an allergic reaction after a previous dose of influenza vaccine, or has any severe, life-threatening allergies.  Has ever had Guillain-Barr Syndrome (also called GBS). In some cases, your health care provider may decide to postpone influenza vaccination to a future visit. People with minor illnesses, such as a cold, may be vaccinated. People who are moderately or severely ill should usually wait until they recover before getting influenza vaccine. Your health care provider can give you more information. 4. Risks of a vaccine reaction  Soreness, redness, and swelling where shot is given, fever, muscle aches, and headache can happen after influenza vaccine.  There may be a  very small increased risk of Guillain-Barr Syndrome (GBS) after inactivated influenza vaccine (the flu shot). Young children who get the flu shot along with pneumococcal vaccine (PCV13), and/or DTaP vaccine at the same time might be slightly more likely to have a seizure caused by fever. Tell your health care provider if a child who is getting flu vaccine has ever had a seizure. People sometimes faint after medical procedures, including vaccination. Tell your provider if you feel dizzy or have vision changes or ringing in the ears. As with any medicine, there is a very remote chance of a vaccine causing a severe allergic reaction, other serious injury, or death. 5. What if there is a serious problem? An allergic reaction could occur after the vaccinated person leaves the clinic. If you see signs of a severe allergic reaction (hives, swelling of the face and throat, difficulty breathing, a fast heartbeat, dizziness, or weakness), call 9-1-1 and get the person to the nearest hospital. For other signs that concern you, call your health care provider. Adverse reactions should be reported to the Vaccine Adverse Event Reporting System (VAERS). Your health care provider will usually file this report, or you can do it yourself. Visit the VAERS website at www.vaers.hhs.gov or call 1-800-822-7967.VAERS is only for reporting reactions, and VAERS staff do not give medical advice. 6. The National Vaccine Injury Compensation Program The National Vaccine Injury Compensation Program (VICP) is a federal program that was created to compensate people who may have been injured by certain vaccines. Visit the VICP website at www.hrsa.gov/vaccinecompensation or call 1-800-338-2382 to learn about the program and about filing a claim. There is a time limit to file a claim for compensation. 7. How can I learn more?  Ask your healthcare provider.  Call your local or state health department.  Contact the Centers for Disease  Control and Prevention (CDC): ? Call 1-800-232-4636 (1-800-CDC-INFO) or ? Visit CDC's www.cdc.gov/flu Vaccine Information Statement (Interim) Inactivated Influenza Vaccine (10/30/2017) This information is not intended to replace advice given to you by your health care provider. Make sure you discuss any questions you have with your health care provider. Document Released: 12/27/2005 Document Revised: 06/23/2018 Document Reviewed: 11/03/2017 Elsevier Patient Education  2020 Elsevier Inc.  

## 2018-12-08 ENCOUNTER — Other Ambulatory Visit: Payer: Self-pay

## 2018-12-08 ENCOUNTER — Ambulatory Visit: Payer: Self-pay

## 2018-12-08 VITALS — BP 120/76 | HR 67 | Resp 14

## 2018-12-08 DIAGNOSIS — Z008 Encounter for other general examination: Secondary | ICD-10-CM

## 2018-12-08 LAB — POCT LIPID PANEL
HDL: 53
LDL: 115
Non-HDL: 131
TC/HDL: 3.5
TC: 184
TRG: 78

## 2018-12-08 LAB — GLUCOSE, POCT (MANUAL RESULT ENTRY): POC Glucose: 122 mg/dl — AB (ref 70–99)

## 2018-12-08 NOTE — Progress Notes (Signed)
     Patient ID: Jerome [name], male    DOB: Jun 01, 1975, 43 y.o.   MRN: [id number]    Thank you!!  Apolonio Schneiders RN  [place] Nurse Specialist [place]: [phone]  Cell:  [phone] Website: [email or web address]

## 2019-03-17 ENCOUNTER — Other Ambulatory Visit: Payer: Self-pay

## 2019-03-17 ENCOUNTER — Ambulatory Visit: Payer: BC Managed Care – PPO

## 2019-03-17 VITALS — BP 142/76 | HR 74 | Resp 17

## 2019-03-17 DIAGNOSIS — I10 Essential (primary) hypertension: Secondary | ICD-10-CM

## 2019-03-17 NOTE — Progress Notes (Signed)
   Subjective:    Patient ID: Jerome [name], male    DOB: Aug 01, 1975, 43 y.o.   MRN: [id number]  Presents to clinic for BP check after follow up with PCP.      Review of Systems  All other systems reviewed and are negative.      Objective:   Physical Exam Vitals and nursing note reviewed.     Assessment & Plan:   Wt Readings from Last 3 Encounters:  10/22/18 263 lb 6.4 oz (119.5 kg)  10/22/18 263 lb (119.3 kg)  03/02/18 254 lb 13.6 oz (115.6 kg)   Temp Readings from Last 3 Encounters:  10/22/18 98.8 F (37.1 C) (Oral)  10/22/18 98.4 F (36.9 C) (Oral)  10/22/18 97.8 F (36.6 C) (Temporal)   BP Readings from Last 3 Encounters:  03/17/19 (!) 142/76  12/08/18 120/76  10/22/18 (!) 172/100   Pulse Readings from Last 3 Encounters:  03/17/19 74  12/08/18 67  10/22/18 (!) 59     Lab Results  Component Value Date   POCGLU 122 (A) 12/08/2018           Thank you!!  [place] Nurse Specialist [place]: [phone]  Cell:  [phone] Website: [email or web address]

## 2019-03-25 ENCOUNTER — Other Ambulatory Visit: Payer: Self-pay

## 2019-03-25 ENCOUNTER — Ambulatory Visit: Payer: BC Managed Care – PPO

## 2019-03-25 VITALS — BP 142/82 | HR 81 | Resp 16

## 2019-03-25 DIAGNOSIS — Z0130 Encounter for examination of blood pressure without abnormal findings: Secondary | ICD-10-CM

## 2019-03-25 NOTE — Progress Notes (Signed)
   Subjective:    Patient ID: Jerome [name], male    DOB: 09/27/1975, 44 y.o.   MRN: [id number]  HPI    Review of Systems     Objective:   Physical Exam  Assessment & Plan:   Wt Readings from Last 3 Encounters:  10/22/18 263 lb 6.4 oz (119.5 kg)  10/22/18 263 lb (119.3 kg)  03/02/18 254 lb 13.6 oz (115.6 kg)   Temp Readings from Last 3 Encounters:  10/22/18 98.8 F (37.1 C) (Oral)  10/22/18 98.4 F (36.9 C) (Oral)  10/22/18 97.8 F (36.6 C) (Temporal)   BP Readings from Last 3 Encounters:  03/25/19 (!) 142/82  03/17/19 (!) 142/76  12/08/18 120/76   Pulse Readings from Last 3 Encounters:  03/25/19 81  03/17/19 74  12/08/18 67     Lab Results  Component Value Date   POCGLU 122 (A) 12/08/2018           Thank you!!  Danise Edge RN  [place]  Occupational Health Nurse Specialist [facility] Direct Dial: [phone]  Cell:  [phone] Website: [email or web address]

## 2019-04-02 ENCOUNTER — Other Ambulatory Visit: Payer: Self-pay

## 2019-04-02 ENCOUNTER — Ambulatory Visit: Payer: Self-pay

## 2019-04-02 VITALS — BP 142/80 | HR 73 | Resp 20

## 2019-04-02 DIAGNOSIS — Z0130 Encounter for examination of blood pressure without abnormal findings: Secondary | ICD-10-CM

## 2019-04-02 NOTE — Progress Notes (Signed)
   Subjective:    Patient ID: Jerome [name], male    DOB: [birth year], 44 y.o.   MRN: [id number]  HPI    Review of Systems     Objective:   Physical Exam  Assessment & Plan:   Wt Readings from Last 3 Encounters:  10/22/18 263 lb 6.4 oz (119.5 kg)  10/22/18 263 lb (119.3 kg)  03/02/18 254 lb 13.6 oz (115.6 kg)   Temp Readings from Last 3 Encounters:  10/22/18 98.8 F (37.1 C) (Oral)  10/22/18 98.4 F (36.9 C) (Oral)  10/22/18 97.8 F (36.6 C) (Temporal)   BP Readings from Last 3 Encounters:  04/02/19 (!) 142/80  03/25/19 (!) 142/82  03/17/19 (!) 142/76   Pulse Readings from Last 3 Encounters:  04/02/19 73  03/25/19 81  03/17/19 74     Lab Results  Component Value Date   POCGLU 122 (A) 12/08/2018           Thank you!!  Danise Edge RN  [place]  Occupational Health Nurse Specialist [facility] Direct Dial: [phone]  Cell:  [phone] Website: [email or web address]

## 2019-04-06 ENCOUNTER — Ambulatory Visit: Payer: Self-pay

## 2019-04-06 ENCOUNTER — Other Ambulatory Visit: Payer: Self-pay

## 2019-04-06 VITALS — BP 130/72 | HR 75 | Resp 16

## 2019-04-06 DIAGNOSIS — Z0000 Encounter for general adult medical examination without abnormal findings: Secondary | ICD-10-CM

## 2019-04-06 DIAGNOSIS — Z0130 Encounter for examination of blood pressure without abnormal findings: Secondary | ICD-10-CM

## 2019-04-06 LAB — GLUCOSE, POCT (MANUAL RESULT ENTRY): POC Glucose: 99 mg/dL (ref 70–99)

## 2019-04-06 NOTE — Progress Notes (Signed)
   Subjective:    Patient ID: Jerome [name], male    DOB: 1975/04/02, 44 y.o.   MRN: [id number]  Presents to clinic with concerns r/t BP. States that his vision is creating a "shadow" beside of an object. Advised he should follow up with his PCP/retina specialist.     Review of Systems  Eyes: Positive for visual disturbance.  All other systems reviewed and are negative.      Objective:   Physical Exam Vitals and nursing note reviewed.  Constitutional:      Appearance: Normal appearance.  HENT:     Head: Normocephalic.  Cardiovascular:     Rate and Rhythm: Normal rate and regular rhythm.     Pulses: Normal pulses.  Pulmonary:     Effort: Pulmonary effort is normal.  Skin:    General: Skin is warm.     Capillary Refill: Capillary refill takes less than 2 seconds.  Neurological:     Mental Status: He is alert and oriented to person, place, and time.     Assessment & Plan:   Wt Readings from Last 3 Encounters:  10/22/18 263 lb 6.4 oz (119.5 kg)  10/22/18 263 lb (119.3 kg)  03/02/18 254 lb 13.6 oz (115.6 kg)   Temp Readings from Last 3 Encounters:  10/22/18 98.8 F (37.1 C) (Oral)  10/22/18 98.4 F (36.9 C) (Oral)  10/22/18 97.8 F (36.6 C) (Temporal)   BP Readings from Last 3 Encounters:  04/06/19 130/72  04/02/19 (!) 142/80  03/25/19 (!) 142/82   Pulse Readings from Last 3 Encounters:  04/06/19 75  04/02/19 73  03/25/19 81     Lab Results  Component Value Date   POCGLU 99 04/06/2019           Thank you!!  Danise Edge RN  [place]  Occupational Health Nurse Specialist [facility] Direct Dial: [phone]  Cell:  [phone] Website: [email or web address]

## 2019-04-12 ENCOUNTER — Other Ambulatory Visit: Payer: Self-pay

## 2019-04-12 ENCOUNTER — Ambulatory Visit: Payer: Self-pay

## 2019-04-12 VITALS — BP 116/74

## 2019-04-12 DIAGNOSIS — Z0130 Encounter for examination of blood pressure without abnormal findings: Secondary | ICD-10-CM

## 2019-04-12 NOTE — Progress Notes (Signed)
   Subjective:    Patient ID: Jerome [name], male    DOB: 28-Aug-1975, 44 y.o.   MRN: [id number]  HPI    Review of Systems     Objective:   Physical Exam  Assessment & Plan:   Wt Readings from Last 3 Encounters:  10/22/18 263 lb 6.4 oz (119.5 kg)  10/22/18 263 lb (119.3 kg)  03/02/18 254 lb 13.6 oz (115.6 kg)   Temp Readings from Last 3 Encounters:  10/22/18 98.8 F (37.1 C) (Oral)  10/22/18 98.4 F (36.9 C) (Oral)  10/22/18 97.8 F (36.6 C) (Temporal)   BP Readings from Last 3 Encounters:  04/12/19 116/74  04/06/19 130/72  04/02/19 (!) 142/80   Pulse Readings from Last 3 Encounters:  04/06/19 75  04/02/19 73  03/25/19 81     Lab Results  Component Value Date   POCGLU 99 04/06/2019           Thank you!!  Danise Edge RN  [place]  Occupational Health Nurse Specialist [facility] Direct Dial: [phone]  Cell:  [phone] Website: [email or web address]

## 2019-04-14 ENCOUNTER — Ambulatory Visit: Payer: Self-pay

## 2019-04-14 ENCOUNTER — Other Ambulatory Visit: Payer: Self-pay

## 2019-04-14 VITALS — BP 142/78 | HR 68 | Resp 16

## 2019-04-14 DIAGNOSIS — Z0130 Encounter for examination of blood pressure without abnormal findings: Secondary | ICD-10-CM

## 2019-04-14 NOTE — Progress Notes (Signed)
   Subjective:    Patient ID: Jerome [name], male    DOB: 1975-12-07, 44 y.o.   MRN: [id number]  Presents to clinic for BP check     Review of Systems     Objective:   Physical Exam  Assessment & Plan:   Wt Readings from Last 3 Encounters:  10/22/18 263 lb 6.4 oz (119.5 kg)  10/22/18 263 lb (119.3 kg)  03/02/18 254 lb 13.6 oz (115.6 kg)   Temp Readings from Last 3 Encounters:  10/22/18 98.8 F (37.1 C) (Oral)  10/22/18 98.4 F (36.9 C) (Oral)  10/22/18 97.8 F (36.6 C) (Temporal)   BP Readings from Last 3 Encounters:  04/14/19 (!) 142/78  04/12/19 116/74  04/06/19 130/72   Pulse Readings from Last 3 Encounters:  04/14/19 68  04/06/19 75  04/02/19 73     Lab Results  Component Value Date   POCGLU 99 04/06/2019           Thank you!!  Danise Edge RN  [place]  Occupational Health Nurse Specialist [facility] Direct Dial: [phone]  Cell:  [phone] Website: [email or web address]

## 2019-06-03 ENCOUNTER — Ambulatory Visit: Payer: BC Managed Care – PPO | Attending: Internal Medicine

## 2019-09-02 DIAGNOSIS — G8929 Other chronic pain: Secondary | ICD-10-CM | POA: Insufficient documentation

## 2019-09-02 DIAGNOSIS — M5441 Lumbago with sciatica, right side: Secondary | ICD-10-CM | POA: Insufficient documentation

## 2019-12-03 ENCOUNTER — Encounter: Payer: Self-pay | Admitting: Emergency Medicine

## 2019-12-03 ENCOUNTER — Ambulatory Visit (INDEPENDENT_AMBULATORY_CARE_PROVIDER_SITE_OTHER): Payer: BC Managed Care – PPO

## 2019-12-03 ENCOUNTER — Other Ambulatory Visit: Payer: Self-pay

## 2019-12-03 ENCOUNTER — Ambulatory Visit
Admission: EM | Admit: 2019-12-03 | Discharge: 2019-12-03 | Disposition: A | Discharge status: Home / Self Care | Payer: BC Managed Care – PPO | Attending: Internal Medicine | Admitting: Internal Medicine

## 2019-12-03 DIAGNOSIS — R0781 Pleurodynia: Secondary | ICD-10-CM

## 2019-12-03 DIAGNOSIS — S2341XA Sprain of ribs, initial encounter: Secondary | ICD-10-CM

## 2019-12-03 MED ORDER — DICLOFENAC SODIUM 1 % EX GEL: 2.0000 g | Freq: Four times a day (QID) | CUTANEOUS | 0 refills | Status: DC

## 2019-12-03 NOTE — Discharge Instructions (Addendum)
Your xray shows normal lungs and ribs, so you just have strained your ribs. Ice area of pain 15-20 minutes at a time for 48h, 3-4 times a day. I will have you apply an anti-inflammation gel  for 7-10 days to help with the pain. It is OK to take Tylenol up to 1000 mg every 6 hours if you need something else for pain.

## 2019-12-03 NOTE — ED Provider Notes (Signed)
[facility]    CSN: [id number] Arrival date & time: 12/03/19  3295      History   Chief Complaint Chief Complaint  Patient presents with   Rib Pain    HPI Jerome [name] is a 44 y.o. male who presents with L anterior lower rib pain x 2 days. He states he was leaning on his left on his couch and sneezed and felt pain on it. The next day noticed it being sore to sneeze, but was able to work fine. This am while driving he sneezed again, and this time the pain on this same area was more severe. Pain is described as sharp, worsened with deep breaths, sneezing, and lifting his R arm up. He denies abdominal pain, fever, chills, or SOB. Had a R pneumothorax in his 20's and is concerned of this again.   Past Medical History:  Diagnosis Date   Hyperlipidemia    Hypertension     There are no problems to display for this patient.   History reviewed. No pertinent surgical history.   Home Medications    Prior to Admission medications   Medication Sig Start Date End Date Taking? Authorizing Provider  amLODipine (NORVASC) 10 MG tablet Take by mouth. 10/28/17 12/03/19 Yes [provider]  aspirin EC 81 MG tablet Take 81 mg by mouth daily. At lunchtime   Yes [provider]  ezetimibe (ZETIA) 10 MG tablet Take by mouth. 10/28/17  Yes [provider]  lisinopril-hydrochlorothiazide (ZESTORETIC) 20-12.5 MG tablet Take 2 tablets by mouth daily. 10/22/18 12/03/19 Yes Sharman Cheek, MD  metoprolol succinate (TOPROL-XL) 100 MG 24 hr tablet Take 100 mg by mouth daily. At lunchtime   Yes [provider]  atorvastatin (LIPITOR) 20 MG tablet Take 20 mg by mouth daily. At lunchtime    [provider]  diclofenac Sodium (VOLTAREN) 1 % GEL Apply 2 g topically 4 (four) times daily. 12/03/19   Rodriguez-Southworth, Nettie Elm, PA-C    Family History Family History  Problem Relation Age of Onset   Hypertension Mother    Hypertension Father      Social History Social History   Tobacco Use   Smoking status: Never Smoker   Smokeless tobacco: Never Used  Building services engineer Use: Never used  Substance Use Topics   Alcohol use: Yes    Alcohol/week: 0.0 standard drinks   Drug use: Never     Allergies   Simvastatin   Review of Systems Review of Systems  Constitutional: Negative for activity change, appetite change, chills, diaphoresis, fatigue and fever.  HENT: Positive for postnasal drip and sneezing. Negative for congestion.   Eyes: Negative for discharge.  Respiratory: Negative for cough, shortness of breath and wheezing.   Cardiovascular: Negative for chest pain and palpitations.  Gastrointestinal: Negative for abdominal pain.  Musculoskeletal: Negative for gait problem and myalgias.  Skin: Negative for rash.  Allergic/Immunologic: Positive for environmental allergies.  Neurological: Negative for headaches.  Hematological: Negative for adenopathy.   Physical Exam Triage Vital Signs ED Triage Vitals  Enc Vitals Group     BP 12/03/19 0834 137/82     Pulse Rate 12/03/19 0834 76     Resp 12/03/19 0834 16     Temp 12/03/19 0834 98.4 F (36.9 C)     Temp Source 12/03/19 0834 Oral     SpO2 12/03/19 0834 99 %     Weight 12/03/19 0831 262 lb (118.8 kg)     Height 12/03/19 0831  6\' 1"  (1.854 m)     Head Circumference --      Peak Flow --      Pain Score 12/03/19 0831 7     Pain Loc --      Pain Edu? --      Excl. in GC? --    No data found.  Updated Vital Signs BP 137/82 (BP Location: Right Arm)    Pulse 76    Temp 98.4 F (36.9 C) (Oral)    Resp 16    Ht 6\' 1"  (1.854 m)    Wt 262 lb (118.8 kg)    SpO2 99%    BMI 34.57 kg/m   Visual Acuity Right Eye Distance:   Left Eye Distance:   Bilateral Distance:    Right Eye Near:   Left Eye Near:    Bilateral Near:     Physical Exam Vitals and nursing note reviewed.  Constitutional:      General: He is not in acute distress.    Appearance: He is  obese. He is not toxic-appearing.  HENT:     Head: Normocephalic.     Right Ear: External ear normal.     Left Ear: External ear normal.  Eyes:     General: No scleral icterus.    Conjunctiva/sclera: Conjunctivae normal.  Cardiovascular:     Rate and Rhythm: Normal rate and regular rhythm.     Heart sounds: No murmur heard.   Pulmonary:     Effort: Pulmonary effort is normal.     Breath sounds: Normal breath sounds. No rhonchi or rales.     Comments: Neg egophony. RIBS- No swelling noted upon inspection. Has local point tenderness on R anterior 12th rib. No crepitations noted.  Chest:     Chest wall: Tenderness present.  Abdominal:     General: Bowel sounds are normal.     Palpations: There is no mass.     Tenderness: There is no abdominal tenderness. There is no guarding or rebound.     Hernia: No hernia is present.  Musculoskeletal:        General: Normal range of motion.     Cervical back: Neck supple.  Skin:    General: Skin is warm and dry.     Findings: No bruising or rash.  Neurological:     Mental Status: He is alert and oriented to person, place, and time.     Gait: Gait normal.  Psychiatric:        Mood and Affect: Mood normal.        Behavior: Behavior normal.        Thought Content: Thought content normal.        Judgment: Judgment normal.      UC Treatments / Results  Labs (all labs ordered are listed, but only abnormal results are displayed) Labs Reviewed - No data to display  EKG   Radiology DG Ribs Unilateral W/Chest Right  Result Date: 12/03/2019 CLINICAL DATA:  Sneeze 2 days ago with anterior right twelfth rib pain on the right EXAM: RIGHT RIBS AND CHEST - 3+ VIEW COMPARISON:  None. FINDINGS: No evidence of fracture. The symptom marker overlaps the chondral cartilage of the ninth rib. The lungs are clear and well aerated. No effusion or pneumothorax. Normal heart size. IMPRESSION: The symptom marker overlaps the anterior right ninth rib  cartilage. No fracture or intrathoracic abnormality. Electronically Signed   By: M.D.   On: 12/03/2019 09:16  Procedures Procedures (including critical care time)  Medications Ordered in UC Medications - No data to display  Initial Impression / Assessment and Plan / UC Course  I have reviewed the triage vital signs and the nursing notes. Pertinent  imaging results that were available during my care of the patient were reviewed by me and considered in my medical decision making (see chart for details). Has train of R anterior rib. Was placed on Votaren gel as noted. See instructions.   Final Clinical Impressions(s) / UC Diagnoses   Final diagnoses:  Sprain of costal cartilage, initial encounter     Discharge Instructions     Your xray shows normal lungs and ribs, so you just have strained your ribs. Ice area of pain 15-20 minutes at a time for 48h, 3-4 times a day. I will have you apply an anti-inflammation gel  for 7-10 days to help with the pain. It is OK to take Tylenol up to 1000 mg every 6 hours if you need something else for pain.      ED Prescriptions    Medication Sig Dispense Auth. Provider   diclofenac Sodium (VOLTAREN) 1 % GEL Apply 2 g topically 4 (four) times daily. 350 g Rodriguez-Southworth, Nettie Elm, PA-C     PDMP not reviewed this encounter.   Garey Ham, [place] 12/03/19 [phone]

## 2019-12-03 NOTE — ED Triage Notes (Signed)
Patient states that he sneezed 2 days ago and felt a sharp pain on his right ribcage.  Patient states that he sneezed again this morning and felt the pain again.  Patient c/o ongoing right sided rib pain/

## 2020-02-03 DIAGNOSIS — M48061 Spinal stenosis, lumbar region without neurogenic claudication: Secondary | ICD-10-CM | POA: Insufficient documentation

## 2020-03-06 ENCOUNTER — Other Ambulatory Visit: Payer: Self-pay | Admitting: *Deleted

## 2020-03-06 DIAGNOSIS — N433 Hydrocele, unspecified: Secondary | ICD-10-CM

## 2020-03-07 ENCOUNTER — Ambulatory Visit: Payer: BC Managed Care – PPO | Admitting: Urology

## 2020-03-07 ENCOUNTER — Encounter: Payer: Self-pay | Admitting: Urology

## 2020-03-07 ENCOUNTER — Other Ambulatory Visit
Admission: RE | Admit: 2020-03-07 | Discharge: 2020-03-07 | Disposition: A | Discharge status: Home / Self Care | Payer: BC Managed Care – PPO | Attending: Urology | Admitting: Urology

## 2020-03-07 ENCOUNTER — Other Ambulatory Visit: Payer: Self-pay

## 2020-03-07 VITALS — BP 132/86 | HR 87 | Ht 73.0 in | Wt 267.0 lb

## 2020-03-07 DIAGNOSIS — N5089 Other specified disorders of the male genital organs: Secondary | ICD-10-CM | POA: Diagnosis not present

## 2020-03-07 DIAGNOSIS — N433 Hydrocele, unspecified: Secondary | ICD-10-CM | POA: Insufficient documentation

## 2020-03-07 DIAGNOSIS — N529 Male erectile dysfunction, unspecified: Secondary | ICD-10-CM

## 2020-03-07 LAB — URINALYSIS, COMPLETE (UACMP) WITH MICROSCOPIC
Bacteria, UA: NONE SEEN
Bilirubin Urine: NEGATIVE
Glucose, UA: NEGATIVE mg/dL
Ketones, ur: NEGATIVE mg/dL
Leukocytes,Ua: NEGATIVE
Nitrite: NEGATIVE
Protein, ur: NEGATIVE mg/dL
Specific Gravity, Urine: 1.02 (ref 1.005–1.030)
WBC, UA: NONE SEEN WBC/hpf (ref 0–5)
pH: 5.5 (ref 5.0–8.0)

## 2020-03-07 MED ORDER — SILDENAFIL CITRATE 20 MG PO TABS: 60.0000 mg | ORAL_TABLET | Freq: Every day | ORAL | 11 refills | Status: DC | PRN

## 2020-03-07 NOTE — Patient Instructions (Signed)
Hydrocele, Adult A hydrocele is a collection of fluid in the loose pouch of skin that holds the testicles (scrotum). This may happen because:  The amount of fluid produced in the scrotum is not absorbed by the rest of the body.  Fluid from the abdomen fills the scrotum. Normally, the testicles develop in the abdomen then move (drop) into to the scrotum before birth. The tube that the testicles travel through usually closes after the testicles drop. If the tube does not close, fluid from the abdomen can fill the scrotum. This is less common in adults. What are the causes? The cause of a hydrocele in adults is usually not known. However, it may be caused by:  An injury to the scrotum.  An infection (epididymitis).  Decreased blood flow to the scrotum.  Twisting of a testicle (testicular torsion).  A birth defect.  A tumor or cancer of the testicle. What are the signs or symptoms? A hydrocele feels like a water-filled balloon. It may also feel heavy. Other symptoms include:  Swelling of the scrotum. The swelling may decrease when you lie down. You may also notice more swelling at night than in the morning.  Swelling of the groin.  Mild discomfort in the scrotum.  Pain. This can develop if the hydrocele was caused by infection or twisting. The larger the hydrocele, the more likely you are to have pain. How is this diagnosed? This condition may be diagnosed based on:  Physical exam.  Medical history. You may also have other tests, including:  Imaging tests, such as ultrasound.  Blood or urine tests. How is this treated? Most hydroceles go away on their own. If you have no discomfort or pain, your health care provider may suggest close monitoring of your condition (called watch and wait or watchful waiting) until the condition goes away or symptoms develop. If treatment is needed, it may include:  Treating an underlying condition. This may include using an antibiotic medicine to  treat an infection.  Surgery to stop fluid from collecting in the scrotum.  Surgery to drain the fluid. Options include: ? Needle aspiration. A needle is used to drain fluid. However, the fluid buildup will come back quickly. ? Hydrocelectomy. For this procedure, an incision is made in the scrotum to remove the fluid sac. Follow these instructions at home:  Watch the hydrocele for any changes.  Take over-the-counter and prescription medicines only as told by your health care provider.  If you were prescribed an antibiotic medicine, use it as told by your health care provider. Do not stop taking the antibiotic even if you start to feel better.  Keep all follow-up visits as told by your health care provider. This is important. Contact a health care provider if:  You notice any changes in the hydrocele.  The swelling in your scrotum or groin gets worse.  The hydrocele becomes red, firm, painful, or tender to the touch.  You have a fever. Get help right away if you:  Develop a lot of pain, or your pain becomes worse. Summary  A hydrocele is a collection of fluid in the loose pouch of skin that holds the testicles (scrotum).  Hydroceles can cause swelling, discomfort, and sometimes pain.  In adults, the cause of a hydrocele usually is not known. However, it is sometimes caused by an infection or a rotation and twisting of the scrotum.  Treatment is usually not needed. Hydroceles often go away on their own. If a hydrocele causes pain, treatment   may be given to ease the pain. This information is not intended to replace advice given to you by your health care provider. Make sure you discuss any questions you have with your health care provider. Document Revised: 03/15/2017 Document Reviewed: 03/15/2017 Elsevier Patient Education  2020 Elsevier Inc.  

## 2020-03-07 NOTE — Progress Notes (Signed)
   03/07/20 10:34 AM   Jerome [name] 13-Mar-1976 [id number]  CC: Scrotal swelling, ED  HPI: I saw Jerome [name] in urology clinic today for evaluation of scrotal swelling and ED.  He is a 44 year old male with a few months of ED and has been tried on Cialis and Revatio.  The Cialis he did not notice any improvement, but the Revatio 60 g he noticed some improvement in the erection, but still have difficulty maintaining erection.  Regarding the scrotal swelling, he is also noticed about 6 months of some mild scrotal swelling.  This is not particularly bothersome, and is not painful.  He has a history of an umbilical hernia, but no inguinal hernias.  No imaging has been performed.  He denies any urinary symptoms.  He is on metoprolol and lisinopril/hydrochlorothiazide for hypertension.   PMH: Past Medical History:  Diagnosis Date  . Hyperlipidemia   . Hypertension      Family History: Family History  Problem Relation Age of Onset  . Hypertension Mother   . Hypertension Father     Social History:  reports that he has never smoked. His smokeless tobacco use includes chew. He reports current alcohol use. He reports that he does not use drugs.  Physical Exam: BP 132/86   Pulse 87   Ht 6\' 1"  (1.854 m)   Wt 267 lb (121.1 kg)   BMI 35.23 kg/m    Constitutional:  Alert and oriented, No acute distress. Cardiovascular: No clubbing, cyanosis, or edema. Respiratory: Normal respiratory effort, no increased work of breathing. GI: Abdomen is soft, nontender, nondistended, no abdominal masses GU: Phallus with patent meatus, testicles 20 cc and descended bilaterally without masses, mild scrotal swelling bilaterally suspect hydroceles, possible left epididymal cyst.  No tenderness.  Laboratory Data: Reviewed  Pertinent Imaging: None to review  Assessment & Plan:   He is a 44 year old male with ED partially responsive to sildenafil and mild minimally symptomatic bilateral scrotal  swelling, likely secondary to small hydroceles.  We discussed the need for further evaluation with a scrotal ultrasound, and will call with these results.  We also discussed different treatment strategies for ED including PDE 5 inhibitors, vacuum erection device, or injections.  He would like to try the higher dose of sildenafil.  -Scrotal ultrasound, call with results -Increase sildenafil to 4-5 tabs as needed, risks and benefits discussed -RTC 4 to 6 weeks for symptom check-consider testosterone in the future, and/or trying alternative HTN medications  53, MD 03/07/2020  [facility] [address], [address] [place], [place] [phone]

## 2020-03-20 ENCOUNTER — Ambulatory Visit
Admission: RE | Admit: 2020-03-20 | Discharge: 2020-03-20 | Disposition: A | Discharge status: Home / Self Care | Payer: BC Managed Care – PPO | Source: Ambulatory Visit | Attending: Urology | Admitting: Urology

## 2020-03-20 ENCOUNTER — Other Ambulatory Visit: Payer: Self-pay

## 2020-03-20 DIAGNOSIS — N5089 Other specified disorders of the male genital organs: Secondary | ICD-10-CM | POA: Insufficient documentation

## 2020-03-21 ENCOUNTER — Telehealth: Payer: Self-pay

## 2020-03-21 NOTE — Telephone Encounter (Signed)
Patient notified

## 2020-03-21 NOTE — Telephone Encounter (Signed)
-----   Message from Sondra Come, MD sent at 03/21/2020  8:09 AM EST ----- No abnormalities on scrotal US, keep follow up as scheduled  Legrand Rams, MD 03/21/2020

## 2020-03-25 ENCOUNTER — Ambulatory Visit
Admission: EM | Admit: 2020-03-25 | Discharge: 2020-03-25 | Disposition: A | Discharge status: Home / Self Care | Payer: BC Managed Care – PPO | Attending: Internal Medicine | Admitting: Internal Medicine

## 2020-03-25 ENCOUNTER — Other Ambulatory Visit: Payer: Self-pay

## 2020-03-25 ENCOUNTER — Encounter: Payer: Self-pay | Admitting: Emergency Medicine

## 2020-03-25 DIAGNOSIS — Z20822 Contact with and (suspected) exposure to covid-19: Secondary | ICD-10-CM

## 2020-03-25 DIAGNOSIS — U071 COVID-19: Secondary | ICD-10-CM | POA: Diagnosis not present

## 2020-03-25 DIAGNOSIS — R6889 Other general symptoms and signs: Secondary | ICD-10-CM

## 2020-03-25 NOTE — ED Provider Notes (Signed)
[facility]    CSN: [id number] Arrival date & time: 03/25/20  1244      History   Chief Complaint Chief Complaint  Patient presents with  . Cough    HPI Jerome [name] is a 45 y.o. male who presents with onset of chills, body aches and cough x 3 days. Has checked his temp a couple of times and was neg. Has mild sweats yesterday and was very fatigued and stayed in the couch all day.    Past Medical History:  Diagnosis Date  . Hyperlipidemia   . Hypertension   . Pneumothorax, spontaneous, tension 2001    There are no problems to display for this patient.   History reviewed. No pertinent surgical history.   Home Medications    Prior to Admission medications   Medication Sig Start Date End Date Taking? Authorizing Provider  aspirin EC 81 MG tablet Take 81 mg by mouth daily. At lunchtime   Yes [provider]  ezetimibe (ZETIA) 10 MG tablet Take by mouth. 10/28/17  Yes [provider]  gabapentin (NEURONTIN) 300 MG capsule Take 300 mg by mouth 3 (three) times daily. 03/02/20  Yes [provider]  lisinopril-hydrochlorothiazide (ZESTORETIC) 20-25 MG tablet Take 1 tablet by mouth daily. 02/17/20  Yes [provider]  metoprolol succinate (TOPROL-XL) 100 MG 24 hr tablet Take 100 mg by mouth daily. At lunchtime   Yes [provider]  sildenafil (REVATIO) 20 MG tablet Take 3-5 tablets (60-100 mg total) by mouth daily as needed. 03/07/20   Sondra Come, MD    Family History Family History  Problem Relation Age of Onset  . Hypertension Mother   . Hypertension Father     Social History Social History   Tobacco Use  . Smoking status: Never Smoker  . Smokeless tobacco: Current User    Types: Chew  Vaping Use  . Vaping Use: Never used  Substance Use Topics  . Alcohol use: Yes    Alcohol/week: 0.0 standard drinks  . Drug use: Never     Allergies   Atorvastatin and Simvastatin   Review of Systems Review  of Systems  Constitutional: Positive for chills, diaphoresis and fatigue. Negative for appetite change and fever.  HENT: Positive for congestion and postnasal drip. Negative for rhinorrhea, sore throat and trouble swallowing.   Eyes: Negative for discharge.  Respiratory: Positive for cough. Negative for chest tightness, shortness of breath and wheezing.   Cardiovascular: Negative for chest pain.  Gastrointestinal: Negative for diarrhea, nausea and vomiting.       Had one loose stool yesterday  Musculoskeletal: Positive for myalgias. Negative for gait problem.  Skin: Negative for rash.  Neurological: Positive for headaches.  Hematological: Negative for adenopathy.     Physical Exam Triage Vital Signs ED Triage Vitals  Enc Vitals Group     BP 03/25/20 1324 (!) 140/98     Pulse Rate 03/25/20 1324 98     Resp 03/25/20 1324 20     Temp 03/25/20 1324 98.6 F (37 C)     Temp Source 03/25/20 1324 Oral     SpO2 03/25/20 1324 99 %     Weight 03/25/20 1319 265 lb (120.2 kg)     Height 03/25/20 1319 6\' 1"  (1.854 m)     Head Circumference --      Peak Flow --      Pain Score 03/25/20 1318 6     Pain Loc --  Pain Edu? --      Excl. in GC? --    No data found.  Updated Vital Signs BP (!) 140/98 (BP Location: Right Arm)   Pulse 98   Temp 98.6 F (37 C) (Oral)   Resp 20   Ht 6\' 1"  (1.854 m)   Wt 265 lb (120.2 kg)   SpO2 99%   BMI 34.96 kg/m   Visual Acuity Right Eye Distance:   Left Eye Distance:   Bilateral Distance:    Right Eye Near:   Left Eye Near:    Bilateral Near:     Physical Exam Physical Exam Vitals signs and nursing note reviewed.  Constitutional:      General: he is not in acute distress.    Appearance: Normal appearance. he is not ill-appearing, toxic-appearing or diaphoretic.  HENT:     Head: Normocephalic.     Right Ear: Tympanic membrane, ear canal and external ear normal.     Left Ear: Tympanic membrane, ear canal and external ear normal.      Nose: Nose normal.     Mouth/Throat:     Mouth: Mucous membranes are moist.  Eyes:     General: No scleral icterus.       Right eye: No discharge.        Left eye: No discharge.     Conjunctiva/sclera: Conjunctivae normal.  Neck:     Musculoskeletal: Neck supple. No neck rigidity.  Cardiovascular:     Rate and Rhythm: Normal rate and regular rhythm.     Heart sounds: No murmur.  Pulmonary:     Effort: Pulmonary effort is normal.     Breath sounds: Normal breath sounds.   Musculoskeletal: Normal range of motion.  Lymphadenopathy:     Cervical: No cervical adenopathy.  Skin:    General: Skin is warm and dry.     Coloration: Skin is not jaundiced.     Findings: No rash.  Neurological:     Mental Status: he is alert and oriented to person, place, and time.     Gait: Gait normal.  Psychiatric:        Mood and Affect: Mood normal.        Behavior: Behavior normal.        Thought Content: Thought content normal.        Judgment: Judgment normal.     UC Treatments / Results  Labs (all labs ordered are listed, but only abnormal results are displayed) Labs Reviewed  SARS CORONAVIRUS 2 (TAT 6-24 HRS)    EKG   Radiology No results found.  Procedures Procedures (including critical care time)  Medications Ordered in UC Medications - No data to display  Initial Impression / Assessment and Plan / UC Course  I have reviewed the triage vital signs and the nursing notes. I suspect he has covid and needs to follow the precautions reviewed. Covid test is pending and will check his Mychart for results. Supportive care advised, see instructions.   Final Clinical Impressions(s) / UC Diagnoses   Final diagnoses:  Flu-like symptoms  Suspected COVID-19 virus infection     Discharge Instructions     If your Covid test ends up positive you may take the following supplements to help your immune system be stronger to fight this viral infection Take Quarcetin 500 mg three times a  day x 7 days with Zinc 50 mg ones a day x 7 days. The quarcetin is an antiviral and anti-inflammatory supplement which  helps open the zinc channels in the cell to absorb Zinc. Zinc helps decrease the virus load in your body. Take Melatonin 6-10 mg at bed time which also helps support your immune system.  Also make sure to take Vit D 5,000 IU per day with a fatty meal and Vit C 5000 mg a day until you are completely better. To prevent viral illnesses your vitamin D should be between 60-80. Stay on Vitamin D 2,000  and C  1000 mg the rest of the season.  Don't lay around, keep active and walk as much as you are able to to prevent worsening of your symptoms.  Follow up with your family Dr next week.  If you get short of breath and you are able to check  your oxygen with a pulse oxygen meter, if it gets to 92% or less, you need to go to the hospital to be admitted. If you dont have one, come back here and we will assess you.      ED Prescriptions    None     PDMP not reviewed this encounter.   Garey Ham, PA-C 03/25/20 1423

## 2020-03-25 NOTE — Discharge Instructions (Addendum)
If your Covid test ends up positive you may take the following supplements to help your immune system be stronger to fight this viral infection Take Quarcetin 500 mg three times a day x 7 days with Zinc 50 mg ones a day x 7 days. The quarcetin is an antiviral and anti-inflammatory supplement which helps open the zinc channels in the cell to absorb Zinc. Zinc helps decrease the virus load in your body. Take Melatonin 6-10 mg at bed time which also helps support your immune system.  Also make sure to take Vit D 5,000 IU per day with a fatty meal and Vit C 5000 mg a day until you are completely better. To prevent viral illnesses your vitamin D should be between 60-80. Stay on Vitamin D 2,000  and C  1000 mg the rest of the season.  Don't lay around, keep active and walk as much as you are able to to prevent worsening of your symptoms.  Follow up with your family Dr next week.  If you get short of breath and you are able to check  your oxygen with a pulse oxygen meter, if it gets to 92% or less, you need to go to the hospital to be admitted. If you dont have one, come back here and we will assess you.   

## 2020-03-25 NOTE — ED Triage Notes (Signed)
Pt c/o of cough, body aches and chills x 3 days.

## 2020-03-26 LAB — SARS CORONAVIRUS 2 (TAT 6-24 HRS): SARS Coronavirus 2: POSITIVE — AB

## 2020-03-27 ENCOUNTER — Telehealth: Payer: Self-pay

## 2020-03-27 NOTE — Telephone Encounter (Signed)
I connected by phone with Jerome [name] on 03/27/2020 at 3:47 PM to discuss the potential use of a new treatment for mild to moderate COVID-19 viral infection in non-hospitalized patients.  This patient is a 45 y.o. male that meets the FDA criteria for Emergency Use Authorization of COVID monoclonal antibody sotrovimab.  Has a (+) direct SARS-CoV-2 viral test result  Has mild or moderate COVID-19   Is NOT hospitalized due to COVID-19  Is within 10 days of symptom onset  Has at least one of the high risk factor(s) for progression to severe COVID-19 and/or hospitalization as defined in EUA.  Specific high risk criteria : Other high risk medical condition per CDC:  Hypertension   I have spoken and communicated the following to the patient or parent/caregiver regarding COVID monoclonal antibody treatment:  1. FDA has authorized the emergency use for the treatment of mild to moderate COVID-19 in adults and pediatric patients with positive results of direct SARS-CoV-2 viral testing who are 87 years of age and older weighing at least 40 kg, and who are at high risk for progressing to severe COVID-19 and/or hospitalization.  2. The significant known and potential risks and benefits of COVID monoclonal antibody, and the extent to which such potential risks and benefits are unknown.  3. Information on available alternative treatments and the risks and benefits of those alternatives, including clinical trials.  4. Patients treated with COVID monoclonal antibody should continue to self-isolate and use infection control measures (e.g., wear mask, isolate, social distance, avoid sharing personal items, clean and disinfect "high touch" surfaces, and frequent handwashing) according to CDC guidelines.   5. The patient or parent/caregiver has the option to accept or refuse COVID monoclonal antibody treatment.  After reviewing this information with the patient, the patient has DECLINED offer to receive  the infusion. Esther Hardy, RN 03/27/2020 3:47 PM

## 2020-04-17 ENCOUNTER — Other Ambulatory Visit: Payer: Self-pay | Admitting: Physical Medicine & Rehabilitation

## 2020-04-17 DIAGNOSIS — M5441 Lumbago with sciatica, right side: Secondary | ICD-10-CM

## 2020-04-17 DIAGNOSIS — M5442 Lumbago with sciatica, left side: Secondary | ICD-10-CM

## 2020-04-25 ENCOUNTER — Telehealth (INDEPENDENT_AMBULATORY_CARE_PROVIDER_SITE_OTHER): Payer: BC Managed Care – PPO | Admitting: Urology

## 2020-04-25 DIAGNOSIS — N529 Male erectile dysfunction, unspecified: Secondary | ICD-10-CM

## 2020-04-25 NOTE — Progress Notes (Signed)
Virtual Visit via Telephone Note  I connected with Jerome [name] on 04/25/20 at  3:30 PM EST by telephone and verified that I am speaking with the correct person using two identifiers.   Patient location: Home Provider location: [facility]   I discussed the limitations, risks, security and privacy concerns of performing an evaluation and management service by telephone and the availability of in person appointments. We discussed the impact of the COVID-19 pandemic on the healthcare system, and the importance of social distancing and reducing patient and provider exposure. I also discussed with the patient that there may be a patient responsible charge related to this service. The patient expressed understanding and agreed to proceed.  Reason for visit: ED/scrotal swelling  History of Present Illness: 45 year old male with ED and scrotal swelling.  He was previously tried on Cialis without any improvement, and was getting moderate results on Revatio 60 mg.  He reports persistent ED despite increasing the Revatio to 100 mg on demand, and is interested in other treatment options.  I recommended checking a testosterone, and we reviewed other options like penile injections, vacuum erection device, or penile prosthesis.  He also is on metoprolol and lisinopril/HCTZ for HTN, which may also be contributing to his ED.  Scrotal US with small hydroceles bilaterally, reassurance provided.  Follow up: RTC for AM testosterone, LH, hct (PSA 1.78 02/28/20) RTC w/PA to discuss ED/low T  I discussed the assessment and treatment plan with the patient. The patient was provided an opportunity to ask questions and all were answered. The patient agreed with the plan and demonstrated an understanding of the instructions.   The patient was advised to call back or seek an in-person evaluation if the symptoms worsen or if the condition fails to improve as anticipated.  I provided 12 minutes of  non-face-to-face time during this encounter.   Sondra Come, MD

## 2020-04-26 ENCOUNTER — Other Ambulatory Visit: Payer: Self-pay

## 2020-04-26 ENCOUNTER — Other Ambulatory Visit
Admission: RE | Admit: 2020-04-26 | Discharge: 2020-04-26 | Disposition: A | Discharge status: Home / Self Care | Payer: BC Managed Care – PPO | Attending: Urology | Admitting: Urology

## 2020-04-26 DIAGNOSIS — N529 Male erectile dysfunction, unspecified: Secondary | ICD-10-CM | POA: Insufficient documentation

## 2020-04-26 LAB — HEMATOCRIT: HCT: 40.4 % (ref 39.0–52.0)

## 2020-04-27 LAB — TESTOSTERONE: Testosterone: 636 ng/dL (ref 264–916)

## 2020-04-27 LAB — LUTEINIZING HORMONE: LH: 4.1 m[IU]/mL (ref 1.7–8.6)

## 2020-05-02 NOTE — Progress Notes (Signed)
05/03/2020 8:35 PM   Baldwin [place] [birth year] [id number]   Referring provider: [facility] [address] [place],  [place] [address]  Chief Complaint  Patient presents with  . Follow-up   Urological history: 1. Bilateral hydroceles - small bilateral hydroceles on 03/20/2020 scrotal ultrasound  2. ED - SHIM 9 - contributing factors of age, obesity, CVA, HTN, HLD and lumbar stenosis - testosterone level 636 ng/dL in 04/2020 - managed with PDE5i's    HPI: Jerome [name] is a 45 y.o. male who presents today for follow up.  He has noticed no improvement with his erections taking the 100 mg sildenafil.  He states he may have an occasional spontaneous erection the mornings of the nights he takes the sildenafil, but the spontaneous erections or the induce erections do not last long enough for satisfactory intercourse.  He is not having any pain or curvature with erections.  PMH: Past Medical History:  Diagnosis Date  . Hyperlipidemia   . Hypertension   . Pneumothorax, spontaneous, tension 2001    Surgical History: No past surgical history on file.  Home Medications:  Allergies as of 05/03/2020      Reactions   Atorvastatin    Other reaction(s): Muscle Pain Elevated CPK   Simvastatin Rash   Elevated CPK Other reaction(s): Muscle Pain Elevated CPK      Medication List       Accurate as of May 03, 2020 11:59 PM. If you have any questions, ask your nurse or doctor.        STOP taking these medications   aspirin EC 81 MG tablet Stopped by: Destiney Sanabia, PA-C   gabapentin 300 MG capsule Commonly known as: NEURONTIN Stopped by: Allora Bains, PA-C   sildenafil 20 MG tablet Commonly known as: REVATIO Stopped by: Crystalee Ventress, PA-C     TAKE these medications   ezetimibe 10 MG tablet Commonly known as: ZETIA Take by mouth.   lisinopril-hydrochlorothiazide 20-25 MG tablet Commonly known as: ZESTORETIC Take 1 tablet by mouth  daily.   metoprolol succinate 100 MG 24 hr tablet Commonly known as: TOPROL-XL Take 100 mg by mouth daily. At lunchtime   tadalafil 20 MG tablet Commonly known as: CIALIS Take 1 tablet (20 mg total) by mouth daily as needed for erectile dysfunction. Started by: Zara Council, PA-C       Allergies:  Allergies  Allergen Reactions  . Atorvastatin     Other reaction(s): Muscle Pain Elevated CPK  . Simvastatin Rash    Elevated CPK Other reaction(s): Muscle Pain Elevated CPK    Family History: Family History  Problem Relation Age of Onset  . Hypertension Mother   . Hypertension Father     Social History:  reports that he has never smoked. His smokeless tobacco use includes chew. He reports current alcohol use. He reports that he does not use drugs.  ROS: Pertinent ROS in HPI  Physical Exam: BP (!) 143/73   Pulse 80   Ht 6' 1"  (1.854 m)   Wt 260 lb (117.9 kg)   BMI 34.30 kg/m   Constitutional:  Well nourished. Alert and oriented, No acute distress. HEENT: [place] AT, mask in place.  Trachea midline Cardiovascular: No clubbing, cyanosis, or edema. Respiratory: Normal respiratory effort, no increased work of breathing. Neurologic: Grossly intact, no focal deficits, moving all 4 extremities. Psychiatric: Normal mood and affect.  Laboratory Data: Lab Results  Component Value Date   TESTOSTERONE 636 04/26/2020  Specimen:  Blood  Ref Range & Units 2 mo ago Comments  PSA (Prostate Specific Antigen), Total <=1.49 ng/mL 1.[place] PSA Screening algorithm, based on a multi-disciplinary consensus panel review of best reported practice in the literature.  All recommendations and treatment decisions should be made in conjunction with the patient after discussion and counseling.   If PSA >= 1.5 ng/ml, consider referral to Urology  If PSA < 1.5 ng/ml, and patient is At Risk, consider screening every two years  If PSA < 1.5 ng/ml, and patient is average  risk, consider resuming screening at age 28   Men At Risk include those of African-American descent and a positive family history (if available)   [employer] Total-PSA Method:   The measured value of this analyte can vary depending upon the testing procedure used. Values determined on patient samples by differing testing procedures cannot be directly compared with one another, and could be cause of erroneous medical interpretation.  Resulting Agency  [facility]   Specimen Collected: 02/28/20 12:06 PM Last Resulted: 02/28/20 9:54 PM  Received From: [place]  Result Received: 02/29/20 9:24 AM     Specimen:  Blood  Ref Range & Units 2 mo ago Comments  Sodium 135 - 145 mmol/L 140    Potassium 3.5 - 5.0 mmol/L 3.4Low    Chloride 98 - 108 mmol/L 102    Carbon Dioxide (CO2) 21 - 30 mmol/L 27    Urea Nitrogen (BUN) 7 - 20 mg/dL 10    Creatinine 0.6 - 1.3 mg/dL 1.0    Glucose 70 - 140 mg/dL 145High  Interpretive Data:  Above is the NONFASTING reference range.   Below are the FASTING reference ranges:  NORMAL:   70-99 mg/dL  PREDIABETES: 100-125 mg/dL  DIABETES:  > 125 mg/dL   Calcium 8.7 - 10.2 mg/dL 9.3    AST (Aspartate Aminotransferase) 15 - 41 U/L 33    ALT (Alanine Aminotransferase) 17 - 63 U/L 49    Bilirubin, Total 0.4 - 1.5 mg/dL 0.6    Alk Phos (Alkaline Phosphatase) 24 - 110 U/L 62    Albumin 3.5 - 4.8 g/dL 3.6    Protein, Total 6.2 - 8.1 g/dL 6.6    Anion Gap 3 - 12 mmol/L 11    BUN/CREA Ratio 6 - 27 10    Glomerular Filtration Rate (eGFR)  mL/min/1.73sq m 90   NON-Modified eGFR: 90 mL/min/1.73sq m .  We recommend using this value for referral decisions.   Modified eGFR : 105 mL/min/1.73sq m .  This eGFR estimates kidney function using the CKD-Epi equation that increases eGFR by 16% for patients identified as Black/African-American in the medical record. This value was previously reported as the single eGFR  before 08/31/2019.   Interpretive Ranges for eGFR(CKD-EPI):   eGFR:       > 60 mL/min/1.73 sq m - Normal  eGFR:       30 - 59 mL/min/1.73 sq m - Moderately Decreased  eGFR:       15 - 29 mL/min/1.73 sq m - Severely Decreased  eGFR:       < 15 mL/min/1.73 sq m - Kidney Failure   Note: These eGFR calculations do not apply in acute situations  when eGFR is changingrapidly or in patients on dialysis.   Resulting Agency  [facility]   Specimen Collected: 02/28/20 12:06 PM Last Resulted: 02/28/20 9:26 PM  Received From: [place]  [facility]  Result Received: 02/29/20 9:24 AM   Specimen:  Blood  Ref Range & Units 2 mo ago Comments  Cholesterol, Total mg/dL 202  The significance of total cholesterol depends on the values of individual components including HDL, LDL, non-HDL, and triglycerides.  LDL Calculated <190 mg/dL 124   <70 mg/dL   Desired target for prior heart disease, stroke, and those at high-risk. Even lower levels may be recommended to decrease risk of heart attack and stroke.  70-159 mg/dL  Comprehensive cardiovascular risk assessment is recommended. Statintherapy may be advised based on risk factors.  160-189 mg/dL Moderately elevated LDL level. Statin therapy recommended if other risk factors present.  >=190 mg/dL  Severely elevated LDL level. High long-term risk of heart disease and stroke. High-intensity statin therapy recommended for most people. Consider specialist referral.  *A healthy diet and exercise are recommended for all to reduce heart disease risk. Statin choice should be based on patient preference after patient-provider discussions.    Ref: 2018 ACC/AHA Guideline  HDL mg/dL 48   People with low HDL levels (see below) are at increased risk of heart disease:  <50 mg/dL for Women  <40 mg/dL for Men  Triglyceride <500 mg/dL 149   <150 mg/dL   Normal  150-499 mg/dL  High Triglycerides. Risk of  heart disease may be increased. Address reversible causes (eg sugar in foods and beverages, alcohol, and diabetes control). Medication may be appropriate based on other clinical factors.  >=500 mg/dL   Very High Triglycerides. Risk of heart disease and pancreatitis increased. Address reversible causes as above. Medication to lower triglycerides usually advised.   *Ranges provided for adults, pediatric guidelines vary.  Resulting Agency  [place]   Specimen Collected: 02/28/20 12:06 PM Last Resulted: 02/28/20 9:26 PM  Received From: [place]  Result Received: 02/29/20 9:24 AM     Specimen:  Blood  Ref Range & Units 1 yr ago  Thyroid Stimulating Hormone (TSH) 0.34 - 5.66 IU/mL 1.13   Resulting Agency  [facility]  Specimen Collected: 10/28/18 10:41 AM Last Resulted: 10/28/18 3:55 PM  Received From: [facility]  Result Received: 12/02/18 10:14 AM    Urinalysis    Component Value Date/Time   COLORURINE YELLOW 03/07/2020 0946   APPEARANCEUR CLEAR 03/07/2020 0946   LABSPEC 1.020 03/07/2020 0946   PHURINE 5.5 03/07/2020 0946   GLUCOSEU NEGATIVE 03/07/2020 0946   HGBUR TRACE (A) 03/07/2020 0946   BILIRUBINUR NEGATIVE 03/07/2020 0946   KETONESUR NEGATIVE 03/07/2020 0946   PROTEINUR NEGATIVE 03/07/2020 0946   NITRITE NEGATIVE 03/07/2020 0946   LEUKOCYTESUR NEGATIVE 03/07/2020 0946    I have reviewed the labs.   Pertinent Imaging: No new imaging since last visit  Assessment & Plan:   1. ED - SHIM score is 9 - We discussed trying a different PDE5 inhibitor, intra-urethral suppositories, intracavernous vasoactive drug injection therapy, vacuum constriction device and WAVE therapy  - start tadalafil 20 mg daily  - patient will MyChart me results   Return if symptoms worsen or fail to improve, for follow up per Mychart .  These notes generated with voice recognition software. I apologize for  typographical errors.  Zara Council, PA-C  [facility] [address]  [place], [place] [address] [phone]

## 2020-05-03 ENCOUNTER — Other Ambulatory Visit: Payer: Self-pay

## 2020-05-03 ENCOUNTER — Ambulatory Visit (INDEPENDENT_AMBULATORY_CARE_PROVIDER_SITE_OTHER): Payer: BC Managed Care – PPO | Admitting: Urology

## 2020-05-03 ENCOUNTER — Encounter: Payer: Self-pay | Admitting: Urology

## 2020-05-03 VITALS — BP 143/73 | HR 80 | Ht 73.0 in | Wt 260.0 lb

## 2020-05-03 DIAGNOSIS — N529 Male erectile dysfunction, unspecified: Secondary | ICD-10-CM | POA: Diagnosis not present

## 2020-05-03 MED ORDER — TADALAFIL 20 MG PO TABS: 20.0000 mg | ORAL_TABLET | Freq: Every day | ORAL | 3 refills | Status: DC | PRN

## 2020-05-08 ENCOUNTER — Other Ambulatory Visit: Payer: Self-pay

## 2020-05-08 ENCOUNTER — Ambulatory Visit
Admission: RE | Admit: 2020-05-08 | Discharge: 2020-05-08 | Disposition: A | Discharge status: Home / Self Care | Payer: BC Managed Care – PPO | Source: Ambulatory Visit | Attending: Physical Medicine & Rehabilitation | Admitting: Physical Medicine & Rehabilitation

## 2020-05-08 DIAGNOSIS — M5441 Lumbago with sciatica, right side: Secondary | ICD-10-CM | POA: Insufficient documentation

## 2020-05-08 DIAGNOSIS — M5442 Lumbago with sciatica, left side: Secondary | ICD-10-CM | POA: Diagnosis present

## 2020-05-17 ENCOUNTER — Ambulatory Visit: Payer: BC Managed Care – PPO | Attending: Physical Medicine & Rehabilitation

## 2020-05-17 ENCOUNTER — Other Ambulatory Visit: Payer: Self-pay

## 2020-05-17 DIAGNOSIS — M5441 Lumbago with sciatica, right side: Secondary | ICD-10-CM | POA: Insufficient documentation

## 2020-05-17 DIAGNOSIS — G8929 Other chronic pain: Secondary | ICD-10-CM | POA: Insufficient documentation

## 2020-05-17 DIAGNOSIS — M5442 Lumbago with sciatica, left side: Secondary | ICD-10-CM | POA: Diagnosis present

## 2020-05-17 NOTE — Patient Instructions (Signed)
Access Code: [email or web address] URL: [email or web address] Date: 05/17/2020 Prepared by: Ria Comment  Exercises Prone Press Up - 2 x daily - 7 x weekly - 2 sets - 10 reps - 3s hold Supine Lower Trunk Rotation - 2 x daily - 7 x weekly - 3 reps - 30s hold

## 2020-05-17 NOTE — Therapy (Signed)
[facility] [address]. [place], [place], [address] Phone: [phone]   Fax:  [phone]  Physical Therapy Evaluation  Patient Details  Name: Jerome [name] MRN: [id number] Date of Birth: 06-15-1975 Referring Provider (PT): Dr. Filomena Jungling   Encounter Date: 05/17/2020   PT End of Session - 05/17/20 1606    Visit Number 1    Number of Visits 17    Date for PT Re-Evaluation 07/12/20    Authorization Type eval: 05/17/20    PT Start Time 1345    PT Stop Time 1445    PT Time Calculation (min) 60 min    Activity Tolerance Patient tolerated treatment well    Behavior During Therapy [facility] for tasks assessed/performed           Past Medical History:  Diagnosis Date  . Hyperlipidemia   . Hypertension   . Pneumothorax, spontaneous, tension 2001    History reviewed. No pertinent surgical history.  There were no vitals filed for this visit.   SUBJECTIVE Chief complaint: LBP    History: LBP started a couple of years ago when he sneezed.  Difficulties bending to the floor, numbness down left and right leg and tingling down thighs.  Ischial tuberosity bothering him after first injection.  Has trouble lying on back, more comfortable on stomach.  Has had about 5 injections and then wears off after a couple of days. Feels like his back is tired and a discomfort constantly.  Hasn't gone back to the gym since injury.  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  Lifting aggravates. 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 Mild to moderate spinal canal/neural foraminal narrowing at this level.  Mild left L2-3 and bilateral L3-4 neural foraminal narrowing.L5-S1 central and left subarticular protrusions with moderate left and mild right neural foraminal  narrowing. Referring Dx: LBP with radicular symptoms Referring Provider: Filomena Jungling, MD  Pain location: Previously only on the R side, then he started feeling sxs in tailbone and L side.   Pain: Present 2/10, Best 0/10, Worst 6/10: Pain quality: pain quality: pressure Radiating pain: Yes  Numbness/Tingling: Yes 24 hour pain behavior: pain worse in the morning and then gets better throughout the day when he starts walking around, sleep on right side, by the end of day he says his LBP is feeling more tight or uncomfortable. Denies being woken at night or having trouble falling sleep from pain.  Aggravating factors: bending down, lifting, sitting too long, standing in one spot  Easing factors:walking, heat (maybe?)  How long can you sit:10 minutes at work, 45 minutes on couch at home  How long can you stand: 20 minutes  How long can you walk: unknown  History of back injury, pain, surgery, or therapy: Yes, PT for same LBP a year ago, which helped a little  Follow-up appointment with MD: Yes Dominant hand: right Imaging: Yes  Falls in the last 6 months: No Occupational demands: Walking, sitting 2hr/8hr shift  Hobbies: Fishing, hunting and going to the gym  Goals:Get back to the gym  Red flags (bowel/bladder changes, saddle paresthesia, personal history of cancer, chills/fever, night sweats, unrelenting pain, first onset of insidious LBP <19 y/o): possible numbness in the gluteal region, but unsure if it is from just sitting too long  OBJECTIVE  Mental Status Patient is oriented to person, place and time.  Recent memory is intact.  Remote memory is intact.  Attention span and concentration are intact.  Expressive speech is intact.  Patient's fund of knowledge is within normal limits for educational level.  SENSATION: Grossly intact to light touch bilateral LEs as determined by testing dermatomes L2-S2 Proprioception and hot/cold testing deferred on this date    MUSCULOSKELETAL: Tremor: None Bulk: Normal Tone: Normal No visible step-off along spinal column  Posture WNL  Gait Deferred    Palpation Severe muscle tightness in low back paraspinals, especially around L3-L5.  Tight and stiff hip external rotators.  No specific TPs noted, but a lot of general tightness.   Strength (out of 5) R/L 4+/5 Hip flexion 4+/5 Hip ER 4+/5 Hip IR 5/5 Hip abduction (gross measurement in sitting) 5/5 Hip adduction (gross measurement in sitting)  4+/4+ Hip extension 4+/5 Knee extension 4+/4+ Knee flexion 5/5 Ankle dorsiflexion *Indicates pain    AROM (degrees) R/L (all movements include overpressure unless otherwise stated) Lumbar forward flexion (65): WNL and not a source of pain at this time  Lumbar extension (30): WNL and relieves symptoms  Lumbar lateral flexion (25): Grossly WNL but guarded and increase in pain with R lateral flexion Thoracic and Lumbar rotation (30 degrees): Grossly within normal limits in standing Hip IR (0-45): R: 5 degrees  L: 5 degrees  Hip ER (0-45): R: 50 degrees L: 55 degrees  *Indicates pain   PROM (degrees) Not tested   Repeated Movements Repeated extension in prone is relieving for pain   Muscle Length Hamstrings: R: 55 degrees L: 60 degrees     Passive Accessory Intervertebral Motion (PAIVM) Pt reports back pain with CPA L4 and R UPA L4-L5. Pt denies back pain with CPAs L5 and L3-T12 and UPAs bilaterally at L3-T12.  Generally hypomobile throughout  Passive Physiological Intervertebral Motion (PPIVM) Not tested    SPECIAL TESTS Lumbar Radiculopathy and Discogenic: Centralization and Peripheralization (SN 92, -LR 0.12): Not examined Slump (SN 83, -LR 0.32): R: Negative L: Negative SLR (SN 92, -LR 0.29): R: Negative L:  Negative Crossed SLR (SP 90): R: Negative L: Negative  Facet Joint: Extension-Rotation (SN 100, -LR 0.0): R: Not examined L: Not examined  Lumbar Spinal Stenosis: Lumbar quadrant  (SN 70): R: Not examined L: Not examined  Hip: FABER (SN 81): R: Not examined L: Not examined FADIR (SN 94): R: Not examined L: Not examined Hip scour (SN 50): R: Negative L: Negative  SIJ:  Thigh Thrust (SN 88, -LR 0.18) : R: Not examined L: Not examined  Piriformis Syndrome: FAIR Test (SN 88, SP 83): R: Not examined L: Not examined  Functional Tasks Deferred     ASSESSMENT Pt is a pleasant 46 year-old male referred for low back pain.  Pt presents with deficits in strength, mobility, range of motion, and pain. Pt demonstrated overall 4+/5 in all LE strength on the R, 4+/5 strength in hip extension and knee flexion on the L with all other LE strength being 5/5 on the L.  Examination found very limited hamstring length measuring 60 degrees on the left and 55 degrees on the right.  He also has severely limited ROM in hip IR, measuring only 5 degrees IR on both the R and L hip.  He is moderately to severely limited in hip extension.  The examination also showed severe muscle tightness in the lumbar paraspinals, lats, and external rotators.  Overall, this pt  will benefit from skilled PT services to address deficits and return to pain-free function at home and work.             Objective measurements completed on examination: See above findings.      TREATMENT  Ther-ex  Prone press ups x10 with 10 oscillations at endrange during final wrap; Hooklying lumbothoracic rotation stretch x30 seconds in each direction; Written HEP provided to patient with education about a perform properly and safely at home;       Plan - 05/17/20 1444    Clinical Impression Statement Pt is a pleasant 45 year-old male referred for low back pain.  Pt presents with deficits in strength, mobility, range of motion, and pain. Pt demonstrated overall 4+/5 in all LE strength on the R, 4+/5 strength in hip extension and knee flexion on the L with all other LE strength being 5/5 on the L.  Examination found  very limited hamstring length measuring 60 degrees on the left and 55 degrees on the right.  He also has severely limited ROM in hip IR, measuring only 5 degrees IR on both the R and L hip.  He is moderately to severely limited in hip extension.  The examination also showed severe muscle tightness in the lumbar paraspinals, lats, and external rotators.  Overall, this pt will benefit from skilled PT services to address deficits and return to pain-free function at home and work.    Personal Factors and Comorbidities Comorbidity 2    Comorbidities HTN, Previous stroke    Examination-Activity Limitations Bend;Lift;Sit;Stand    Examination-Participation Restrictions Occupation;Other   Hobbies such as fishing and hunting   Stability/Clinical Decision Making Evolving/Moderate complexity    Clinical Decision Making Moderate    Rehab Potential Good    PT Frequency 2x / week    PT Duration 8 weeks    PT Treatment/Interventions Cryotherapy;Electrical Stimulation;Iontophoresis 4mg /ml Dexamethasone;Moist Heat;Traction;Gait training;Stair training;Functional mobility training;Therapeutic activities;Therapeutic exercise;Balance training;Neuromuscular re-education;Manual techniques;Passive range of motion;Dry needling;Spinal Manipulations;Joint Manipulations;Canalith Repostioning;Ultrasound;Patient/family education;Vestibular    PT Next Visit Plan Functional tests (step ups, squats, lifting), continue with progression of prone activities and lumbar central passive accessory mobility, add to HEP    PT Home Exercise Plan Access Code: [email or web address]    Consulted and Agree with Plan of Care Patient           Patient will benefit from skilled therapeutic intervention in order to improve the following deficits and impairments:  Abnormal gait,Hypomobility,Increased muscle spasms,Impaired sensation,Decreased range of motion,Decreased activity tolerance,Decreased strength,Pain  Visit Diagnosis: Chronic bilateral low back  pain with bilateral sciatica     Problem List Patient Active Problem List   Diagnosis Date Noted  . 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 Chronic right-sided low back pain with right-sided sciatica 09/02/2019  . Abnormal ECG 10/30/2018  . Heart palpitations 10/30/2018  . SOB (shortness of breath) on exertion 10/30/2018  . DDD (degenerative disc disease), lumbar 06/13/2017  . Obesity (BMI 30.0-34.9) 06/13/2017  . Headache disorder 03/23/2015  . Cerebral infarction (HCC) 11/30/2012  . Tests ordered 11/30/2012  . Essential hypertension 07/06/2012  . Hypercholesterolemia 07/06/2012  . Snuff user 07/06/2012    This entire session was performed under direct supervision and direction of a licensed therapist/therapist assistant . I have personally read, edited and approve of the note as written.   Aileen PilotAnnie Michaelson, SPT  Sharalyn InkJason D Huprich PT, DPT, GCS  Huprich,Jason 05/17/2020, 4:18 PM  [facility] [address] [place], [place], [address] Phone: [phone]   Fax:  [phone]  Name: Jerome [name] MRN: [id number] Date of Birth: [birth year]

## 2020-05-19 ENCOUNTER — Other Ambulatory Visit: Payer: Self-pay

## 2020-05-19 ENCOUNTER — Ambulatory Visit: Payer: BC Managed Care – PPO

## 2020-05-19 DIAGNOSIS — G8929 Other chronic pain: Secondary | ICD-10-CM

## 2020-05-19 DIAGNOSIS — M5442 Lumbago with sciatica, left side: Secondary | ICD-10-CM

## 2020-05-19 NOTE — Therapy (Signed)
[facility] [address]. [place], [place], [address] Phone: [phone]   Fax:  [phone]  Physical Therapy Treatment  Patient Details  Name: Jerome [name] MRN: [id number] Date of Birth: 1975-09-09 Referring Provider (PT): Dr. Filomena Jungling   Encounter Date: 05/19/2020   PT End of Session - 05/19/20 0949    Visit Number 2    Number of Visits 17    Date for PT Re-Evaluation 07/12/20    Authorization Type eval: 05/17/20    PT Start Time 0945    PT Stop Time 1030    PT Time Calculation (min) 45 min    Activity Tolerance Patient tolerated treatment well    Behavior During Therapy [facility] for tasks assessed/performed           Past Medical History:  Diagnosis Date  . Hyperlipidemia   . Hypertension   . Pneumothorax, spontaneous, tension 2001    History reviewed. No pertinent surgical history.  There were no vitals filed for this visit.   Subjective Assessment - 05/19/20 0947    Subjective Pt reports 4/10 pain upon arrival particularly on the R side.  He noted that he had some numbness in the R foot for about 3 minutes when he got in the car following last session when driving home.    Pertinent History LBP started a couple of years ago when he sneezed.  Difficulties bending to the floor, numbness down left and right leg and tingling down thighs.  Ischial tuberosity bothering him after first injection.  Has trouble lying on back, more comfortable on stomach.  Has had about 5 injections and then wears off after a couple of days. Feels like his back is tired and a discomfort constantly.  Hasn't gone back to the gym since injury.  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  Lifting aggravates. 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 Mild to moderate  spinal canal/neural foraminal narrowing at this level.  Mild left L2-3 and bilateral L3-4 neural foraminal narrowing.L5-S1 central and left subarticular protrusions with moderate left  and mild right neural foraminal narrowing.  Patient went through physical therapy last year [facility] and reports that it was helpful.    Limitations Sitting;Lifting;Standing    How long can you sit comfortably? 10 minutes at work, 45 minutes in recliner at home    How long can you stand comfortably? 20 minutes    How long can you walk comfortably? unknown    Diagnostic tests MRI, see history    Patient Stated Goals Getting back to routine at the gym    Currently in Pain? Yes    Pain Score 4     Pain Location Back    Pain Orientation Right    Pain Descriptors / Indicators Tightness;Dull    Pain Type Chronic pain    Pain Onset More than a month ago    Pain Frequency Constant           TREATMENT     Ther-Ex NuStep, L1, 9 minutes, for warmup during subjective  Overhead squat test, L side bias, BL hip ER, poor ankle mobility inhibits full range  Step down test, a lot of right ankle pronation and eversion, moderate loss of balance   Box Lift with good bending and lifting form, x5 with 10lbs,  education provided to keep weight as close to center of mass as possible; Prone press ups, therapist applied resistance at hips, 2x12 with x10 little oscillations on last press up Hooklying, clams with manual resistance, x12   Hooklying, bridges, back extension is ok due to pt presentation x10  Hookllying, piriformis figure 4 stretch, 2x30s each leg    Manual Therapy CPAs L1-L5, grades III/IV, 2x30s bouts UPAs R and L, L1-L5. Grades II/III, 2x30s bouts  Instrumented assisted STM to lumbar region with focus on thoracolumbar fascia and lumbar paraspinals    Pt presented to PT with excelled motivation to participate in PT.  The pt was introduced to the NuStep, which he tolerated well.  Pt completed three  functional tests (overhad squat, step down, and box lift) with little problems.  His form was easily corrected with verbal cues.  Pt also introduced to a few new exercises and stretches.  Pt reported that he was in less pain at the end of session than when he arrived (2/10).  Pt continues to have severely tight muscles, which are overall inhibiting his motion, mobility and pain.  This pt will benefit from continued PT to address deficits in mobility, muscle tightness, and pain in order to achieve a better more functional QOL.                       PT Education - 05/19/20 1046    Education Details Updated HEP    Person(s) Educated Patient    Methods Explanation    Comprehension Verbalized understanding            PT Short Term Goals - 05/17/20 1553      PT SHORT TERM GOAL #1   Title Pt will be independent with HEP in order to improve strength and decrease back pain in order to improve pain-free function at home and work    Time 4    Period Weeks    Status New    Target Date 06/14/20             PT Long Term Goals - 05/17/20 1554      PT LONG TERM GOAL #1   Title Pt will decrease mODI score by at least 13 points in order demonstrate clinically significant reduction in back pain/disability.    Baseline 05/17/20: 40%    Time 8    Period Weeks    Status New    Target Date 07/12/20      PT LONG TERM GOAL #2   Title Pt will decrease worst back pain as reported on NPRS by at least 2 points in order to demonstrate clinically significant reduction in back pain.    Baseline 05/17/20: 6/10    Time 8    Period Weeks    Status New    Target Date 07/12/20      PT LONG TERM GOAL #3   Title Pt will increase strength of by at least 1/2 MMT grade of hip extension and knee flexion bilaterally  in order to demonstrate improvement in strength and function.    Baseline 05/17/20: L+R hip extensio: 4+/5, L+R knee flexion: 4+5    Time 8    Period Weeks    Status New    Target Date  07/12/20      PT LONG TERM GOAL #4   Title Patient improved FOTO score to at least 68 in order to demonstrate significant improvement in his function at home and work  Baseline 05/17/2020: 55    Time 8    Period Weeks    Status New    Target Date 07/12/20                 Plan - 05/19/20 1040    Clinical Impression Statement Pt presented to PT with excellent motivation to participate in PT.  The pt was introduced to the NuStep, which he tolerated well without a reported increase in pain.  Pt completed three functional tests (overhad squat, step down, and box lift) with little problems with the exception of notable impairment in ankle dorsilfexion and L side bias during deep squat.  Pt also introduced to a few new exercises and stretches.  Pt reported that he was in less pain at the end of session than when he arrived (2/10).  Pt continues to have severely tight muscles, which are overall inhibiting his motion, mobility and pain.  This pt will benefit from continued PT to address deficits in mobility, muscle tightness, and pain in order to achieve a better more functional QOL.    Personal Factors and Comorbidities Comorbidity 2    Comorbidities HTN, Previous stroke    Examination-Activity Limitations Bend;Lift;Sit;Stand    Examination-Participation Restrictions Occupation;Other   Hobbies such as fishing and hunting   Stability/Clinical Decision Making Evolving/Moderate complexity    Rehab Potential Good    PT Frequency 2x / week    PT Duration 8 weeks    PT Treatment/Interventions Cryotherapy;Electrical Stimulation;Iontophoresis 4mg /ml Dexamethasone;Moist Heat;Traction;Gait training;Stair training;Functional mobility training;Therapeutic activities;Therapeutic exercise;Balance training;Neuromuscular re-education;Manual techniques;Passive range of motion;Dry needling;Spinal Manipulations;Joint Manipulations;Canalith Repostioning;Ultrasound;Patient/family education;Vestibular    PT Next  Visit Plan Functional tests (step ups, squats, lifting), continue with progression of prone activities and lumbar central passive accessory mobility, add to HEP    PT Home Exercise Plan Access Code: [email or web address]    Consulted and Agree with Plan of Care Patient           Patient will benefit from skilled therapeutic intervention in order to improve the following deficits and impairments:  Abnormal gait,Hypomobility,Increased muscle spasms,Impaired sensation,Decreased range of motion,Decreased activity tolerance,Decreased strength,Pain  Visit Diagnosis: Chronic bilateral low back pain with bilateral sciatica     Problem List Patient Active Problem List   Diagnosis Date Noted  . 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 Chronic right-sided low back pain with right-sided sciatica 09/02/2019  . Abnormal ECG 10/30/2018  . Heart palpitations 10/30/2018  . SOB (shortness of breath) on exertion 10/30/2018  . DDD (degenerative disc disease), lumbar 06/13/2017  . Obesity (BMI 30.0-34.9) 06/13/2017  . Headache disorder 03/23/2015  . Cerebral infarction (HCC) 11/30/2012  . Tests ordered 11/30/2012  . Essential hypertension 07/06/2012  . Hypercholesterolemia 07/06/2012  . Snuff user 07/06/2012    This entire session was performed under direct supervision and direction of a licensed therapist/therapist assistant . I have personally read, edited and approve of the note as written.   07/08/2012, SPT Aileen Pilot PT, DPT, GCS  Huprich,Jason 05/19/2020, 10:50 AM  [facility] [address] [place], [place], [place] Phone: [phone]   Fax:  [phone]  Name: Jerome [name] MRN: Ernestina Patches Date of Birth: September 06, 1975

## 2020-05-19 NOTE — Patient Instructions (Signed)
Access Code: [email or web address] URL: [email or web address] Date: 05/19/2020 Prepared by: Ria Comment  Exercises Prone Press Up - 2 x daily - 7 x weekly - 2 sets - 10 reps - 3s hold Supine Lower Trunk Rotation - 2 x daily - 7 x weekly - 3 reps - 30s hold Supine Piriformis Stretch with Foot on Ground - 2 x daily - 7 x weekly - 3 reps - 30s hold Supine Hip External Rotation Stretch - 2 x daily - 7 x weekly - 3 reps - 30s hold Seated Piriformis Stretch with Trunk Bend - 2 x daily - 7 x weekly - 3 reps - 30s hold Seated Piriformis Stretch - 2 x daily - 7 x weekly - 3 reps - 30s hold

## 2020-05-22 ENCOUNTER — Ambulatory Visit: Payer: BC Managed Care – PPO

## 2020-05-22 ENCOUNTER — Other Ambulatory Visit: Payer: Self-pay

## 2020-05-22 DIAGNOSIS — G8929 Other chronic pain: Secondary | ICD-10-CM

## 2020-05-22 DIAGNOSIS — M5442 Lumbago with sciatica, left side: Secondary | ICD-10-CM | POA: Diagnosis not present

## 2020-05-22 NOTE — Patient Instructions (Addendum)
Access Code: [email or web address] URL: [email or web address] Date: 05/22/2020 Prepared by: Ria Comment  Exercises Supine Transversus Abdominis Bracing with Leg Extension - 2 x daily - 4 x weekly - 2 sets - 10 reps - 3s hold Supine Bridge - 2 x daily - 4 x weekly - 2 sets - 10 reps - 3s hold Prone Press Up - 2 x daily - 7 x weekly - 2 sets - 10 reps - 3s hold Supine Lower Trunk Rotation - 2 x daily - 7 x weekly - 3 reps - 30s hold Supine Piriformis Stretch with Foot on Ground - 2 x daily - 7 x weekly - 3 reps - 30s hold Supine Hip External Rotation Stretch - 2 x daily - 7 x weekly - 3 reps - 30s hold Hip Flexor Stretch at Edge of Bed - 2 x daily - 7 x weekly - 3 reps - 30s hold

## 2020-05-22 NOTE — Therapy (Signed)
[facility] [address] [place], [place], [address] Phone: [phone]   Fax:  [phone]  Physical Therapy Treatment  Patient Details  Name: Jerome [name] MRN: [id number] Date of Birth: May 16, 1975 Referring Provider (PT): Dr. Filomena Jungling   Encounter Date: 05/22/2020   PT End of Session - 05/22/20 0803    Visit Number 3    Number of Visits 17    Date for PT Re-Evaluation 07/12/20    Authorization Type eval: 05/17/20    PT Start Time 0803    PT Stop Time 0845    PT Time Calculation (min) 42 min    Activity Tolerance Patient tolerated treatment well    Behavior During Therapy [facility] for tasks assessed/performed           Past Medical History:  Diagnosis Date  . Hyperlipidemia   . Hypertension   . Pneumothorax, spontaneous, tension 2001    History reviewed. No pertinent surgical history.  There were no vitals filed for this visit.   Subjective Assessment - 05/22/20 0802    Subjective Pt reports 3/10 pain upon arrival particularly on the R side.  He did not have any numbness after last session like he reported previously.  He does report some left-sided neck pain since the last session.  No specific questions upon arrival.    Pertinent History LBP started a couple of years ago when he sneezed.  Difficulties bending to the floor, numbness down left and right leg and tingling down thighs.  Ischial tuberosity bothering him after first injection.  Has trouble lying on back, more comfortable on stomach.  Has had about 5 injections and then wears off after a couple of days. Feels like his back is tired and a discomfort constantly.  Hasn't gone back to the gym since injury.  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  Lifting aggravates. 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  Inferiorly migrated right L4-5 paracentral extrusion abutting the  descending L5 nerve root. Mild to moderate spinal canal/neural foraminal narrowing at this level.  Mild left L2-3 and bilateral L3-4 neural foraminal narrowing.L5-S1 central and left subarticular protrusions with moderate left  and mild right neural foraminal narrowing.  Patient went through physical therapy last year [facility] and reports that it was helpful.    Limitations Sitting;Lifting;Standing    How long can you sit comfortably? 10 minutes at work, 45 minutes in recliner at home    How long can you stand comfortably? 20 minutes    How long can you walk comfortably? unknown    Diagnostic tests MRI, see history    Patient Stated Goals Getting back to routine at the gym    Currently in Pain? Yes    Pain Score 3     Pain Location Back    Pain Orientation Right    Pain Descriptors / Indicators Tightness    Pain Type Chronic pain    Pain Onset More than a month ago    Pain Frequency Constant              TREATMENT    Ther-Ex NuStep (seat position 11, arms 11) L1-3 x , for warmup during subjective (4 minutes unbilled);  Prone straight knee hip extension with belt lock around L4/5 to prevent compensatory rotation x 15 BLE; Hooklying partial bridges x 10, attempted full bridges but increases pain so discontinued;  Hooklying, clams with manual resistance x 15; Hooklying adductor squeezes with manual resistance x 15; Hooklying marches with transverse abdominis contraction x 15 BLE; Hooklying single leg bicycles with transverse abdominis contraction x 15 BLE;   Manual Therapy CPA L1-L5, grade III/IV, 2x30s bouts each level; R UPA L1-L5, grade III/IV, 2x30s bouts each level; Prone press ups with belt lock around L4/5 level 2 x 15, pt denies any increase in pain; Prone right external rotators stretch 45 seconds x 2; Supine right hip flexor stretch off sided table with patient performing lumbar lock holding left single knee-to-chest x45 seconds (added to HEP);   Pt  educated throughout session about proper posture and technique with exercises. Improved exercise technique, movement at target joints, use of target muscles after min to mod verbal, visual, tactile cues.    Pt presented to PT with excellent motivation.  Overall he reports physical therapy is helping with the tightness in his low back.  He is unsure if it is helping with his pain at this point.  Continued with manual techniques to lumbar spine as well as hip and low back stretching.  Included additional strengthening today for his back and hips.  Patient denies any increase in pain throughout session. HEP modifications provided today with additional exercises and stretches.  Patient encouraged to continue his HEP and follow-up as scheduled.  He will benefit from continued PT to address deficits in mobility, muscle tightness, and pain in order to achieve a better more functional QOL.                             PT Education - 05/22/20 0856    Education Details Updated HEP    Person(s) Educated Patient    Methods Explanation;Handout    Comprehension Verbalized understanding            PT Short Term Goals - 05/17/20 1553      PT SHORT TERM GOAL #1   Title Pt will be independent with HEP in order to improve strength and decrease back pain in order to improve pain-free function at home and work    Time 4    Period Weeks    Status New    Target Date 06/14/20             PT Long Term Goals - 05/17/20 1554      PT LONG TERM GOAL #1   Title Pt will decrease mODI score by at least 13 points in order demonstrate clinically significant reduction in back pain/disability.    Baseline 05/17/20: 40%    Time 8    Period Weeks    Status New    Target Date 07/12/20      PT LONG TERM GOAL #2   Title Pt will decrease worst back pain as reported on NPRS by at least 2 points in order to demonstrate clinically significant reduction in back pain.    Baseline 05/17/20: 6/10     Time 8    Period Weeks    Status New    Target Date 07/12/20      PT LONG TERM GOAL #3   Title Pt will increase strength of by at least 1/2 MMT grade of hip extension and knee flexion bilaterally  in order to demonstrate improvement in strength and function.    Baseline 05/17/20: L+R hip extensio: 4+/5, L+R knee flexion: 4+5    Time 8    Period Weeks  Status New    Target Date 07/12/20      PT LONG TERM GOAL #4   Title Patient improved FOTO score to at least 68 in order to demonstrate significant improvement in his function at home and work    Baseline 05/17/2020: 55    Time 8    Period Weeks    Status New    Target Date 07/12/20                 Plan - 05/22/20 0807    Clinical Impression Statement Pt presented to PT with excellent motivation.  Overall he reports physical therapy is helping with the tightness in his low back.  He is unsure if it is helping with his pain at this point.  Continued with manual techniques to lumbar spine as well as hip and low back stretching.  Included additional strengthening today for his back and hips.  Patient denies any increase in pain throughout session. HEP modifications provided today with additional exercises and stretches.  Patient encouraged to continue his HEP and follow-up as scheduled.  He will benefit from continued PT to address deficits in mobility, muscle tightness, and pain in order to achieve a better more functional QOL.    Personal Factors and Comorbidities Comorbidity 2    Comorbidities HTN, Previous stroke    Examination-Activity Limitations Bend;Lift;Sit;Stand    Examination-Participation Restrictions Occupation;Other   Hobbies such as fishing and hunting   Stability/Clinical Decision Making Evolving/Moderate complexity    Rehab Potential Good    PT Frequency 2x / week    PT Duration 8 weeks    PT Treatment/Interventions Cryotherapy;Electrical Stimulation;Iontophoresis 4mg /ml Dexamethasone;Moist Heat;Traction;Gait  training;Stair training;Functional mobility training;Therapeutic activities;Therapeutic exercise;Balance training;Neuromuscular re-education;Manual techniques;Passive range of motion;Dry needling;Spinal Manipulations;Joint Manipulations;Canalith Repostioning;Ultrasound;Patient/family education;Vestibular    PT Next Visit Plan continue with progression of prone activities and lumbar central passive accessory mobility, add to HEP    PT Home Exercise Plan Access Code: [email or web address]    Consulted and Agree with Plan of Care Patient           Patient will benefit from skilled therapeutic intervention in order to improve the following deficits and impairments:  Abnormal gait,Hypomobility,Increased muscle spasms,Impaired sensation,Decreased range of motion,Decreased activity tolerance,Decreased strength,Pain  Visit Diagnosis: Chronic bilateral low back pain with bilateral sciatica     Problem List Patient Active Problem List   Diagnosis Date Noted  . 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 Chronic right-sided low back pain with right-sided sciatica 09/02/2019  . Abnormal ECG 10/30/2018  . Heart palpitations 10/30/2018  . SOB (shortness of breath) on exertion 10/30/2018  . DDD (degenerative disc disease), lumbar 06/13/2017  . Obesity (BMI 30.0-34.9) 06/13/2017  . Headache disorder 03/23/2015  . Cerebral infarction (HCC) 11/30/2012  . Tests ordered 11/30/2012  . Essential hypertension 07/06/2012  . Hypercholesterolemia 07/06/2012  . Snuff user 07/06/2012   07/08/2012 PT, DPT, [facility]  Huprich,Jason 05/22/2020, 8:57 AM  [facility] [address] [place], [place], [place] Phone: [phone]   Fax:  [phone]  Name: Jerome [name] MRN: Ernestina Patches Date of Birth: February 19, 1976

## 2020-05-24 ENCOUNTER — Other Ambulatory Visit: Payer: Self-pay

## 2020-05-24 ENCOUNTER — Ambulatory Visit: Payer: BC Managed Care – PPO

## 2020-05-24 DIAGNOSIS — G8929 Other chronic pain: Secondary | ICD-10-CM

## 2020-05-24 DIAGNOSIS — M5442 Lumbago with sciatica, left side: Secondary | ICD-10-CM

## 2020-05-24 NOTE — Therapy (Signed)
[facility] [address] [place], [place], [address] Phone: [phone]   Fax:  [phone]  Physical Therapy Treatment  Patient Details  Name: Jerome [name] MRN: [id number] Date of Birth: 05-Nov-1975 Referring Provider (PT): Dr. Filomena Jungling   Encounter Date: 05/24/2020   PT End of Session - 05/24/20 0902    Visit Number 4    Number of Visits 17    Date for PT Re-Evaluation 07/12/20    Authorization Type eval: 05/17/20    PT Start Time 0848    PT Stop Time 0930    PT Time Calculation (min) 42 min    Activity Tolerance Patient tolerated treatment well    Behavior During Therapy [facility] for tasks assessed/performed           Past Medical History:  Diagnosis Date  . Hyperlipidemia   . Hypertension   . Pneumothorax, spontaneous, tension 2001    History reviewed. No pertinent surgical history.  There were no vitals filed for this visit.   Subjective Assessment - 05/24/20 0853    Subjective Pt reports 2/10 low back upon arrival today. He did have significant soreness yesterday but it feels improved today.  No specific questions upon arrival.    Pertinent History LBP started a couple of years ago when he sneezed.  Difficulties bending to the floor, numbness down left and right leg and tingling down thighs.  Ischial tuberosity bothering him after first injection.  Has trouble lying on back, more comfortable on stomach.  Has had about 5 injections and then wears off after a couple of days. Feels like his back is tired and a discomfort constantly.  Hasn't gone back to the gym since injury.  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  Lifting aggravates. 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 Mild to moderate spinal canal/neural foraminal narrowing at  this level.  Mild left L2-3 and bilateral L3-4 neural foraminal narrowing.L5-S1 central and left subarticular protrusions with moderate left  and mild right neural foraminal narrowing.  Patient went through physical therapy last year [place] [facility] and reports that it was helpful.    Limitations Sitting;Lifting;Standing    How long can you sit comfortably? 10 minutes at work, 45 minutes in recliner at home    How long can you stand comfortably? 20 minutes    How long can you walk comfortably? unknown    Diagnostic tests MRI, see history    Patient Stated Goals Getting back to routine at the gym    Currently in Pain? Yes    Pain Score 2     Pain Location Back    Pain Orientation Right    Pain Descriptors / Indicators Tightness    Pain Type Chronic pain    Pain Onset More than a month ago    Pain Frequency Constant              TREATMENT    Ther-Ex NuStep (seat position 11, arms 11) L2-4 x , for warmup during subjective (4 minutes unbilled);  Hooklying partial bridges x 10, improved range of motion without pain today; Hooklying, clams with manual resistance x 15; Hooklying adductor squeezes with manual resistance x 15; Hooklying SLR with transverse abdominis contraction x 15 BLE; Hook lying mainly resisted lumbar rotation with therapist providing resistance at knees  5-second hold x5 each direction; Hooklying physioball holds in front of chest with therapist performing dynamic perturbations in all directions 30s x 2; 15# weight box lifts from floor to waist with demonstration provided for proper technique 2 x 10, pt demonstrates good hip hinge with neutral spine;   Manual Therapy Prone press ups with belt lock around L4/5 level 3 x 10, during final rep pt performs 10 oscillations at end range extension, pt denies any increase in pain; CPA L1-L5, grade III/IV, 2x30s bouts each level; Bilateral UPA L1-L5, grade III/IV, 2x30s bouts each level; Brief STM perform to  bilateral lumbar paraspinals with Hypervolt; Supine R piriformis stretch 30s hold x 2;   Pt educated throughout session about proper posture and technique with exercises. Improved exercise technique, movement at target joints, use of target muscles after min to mod verbal, visual, tactile cues.    Pt presented to PT with excellent motivation.  Despite soreness after therapy his resting pain upon arrival has improved with each visit. Continued with manual techniques to lumbar spine as well as hip and low back stretching.  Included additional strengthening today for his back and hips.  Patient denies any increase in pain throughout session.  Patient encouraged to continue his HEP and follow-up as scheduled.  He will benefit from continued PT to address deficits in mobility, muscle tightness, and pain in order to achieve a better more functional QOL.                              PT Short Term Goals - 05/17/20 1553      PT SHORT TERM GOAL #1   Title Pt will be independent with HEP in order to improve strength and decrease back pain in order to improve pain-free function at home and work    Time 4    Period Weeks    Status New    Target Date 06/14/20             PT Long Term Goals - 05/17/20 1554      PT LONG TERM GOAL #1   Title Pt will decrease mODI score by at least 13 points in order demonstrate clinically significant reduction in back pain/disability.    Baseline 05/17/20: 40%    Time 8    Period Weeks    Status New    Target Date 07/12/20      PT LONG TERM GOAL #2   Title Pt will decrease worst back pain as reported on NPRS by at least 2 points in order to demonstrate clinically significant reduction in back pain.    Baseline 05/17/20: 6/10    Time 8    Period Weeks    Status New    Target Date 07/12/20      PT LONG TERM GOAL #3   Title Pt will increase strength of by at least 1/2 MMT grade of hip extension and knee flexion bilaterally  in order to  demonstrate improvement in strength and function.    Baseline 05/17/20: L+R hip extensio: 4+/5, L+R knee flexion: 4+5    Time 8    Period Weeks    Status New    Target Date 07/12/20      PT LONG TERM GOAL #4   Title Patient improved FOTO score to at least 68 in order to demonstrate significant improvement in his function at home and work    Baseline 05/17/2020: 55  Time 8    Period Weeks    Status New    Target Date 07/12/20                 Plan - 05/24/20 9741    Clinical Impression Statement Pt presented to PT with excellent motivation.  Despite soreness after therapy his resting pain upon arrival has improved with each visit. Continued with manual techniques to lumbar spine as well as hip and low back stretching.  Included additional strengthening today for his back and hips.  Patient denies any increase in pain throughout session.  Patient encouraged to continue his HEP and follow-up as scheduled.  He will benefit from continued PT to address deficits in mobility, muscle tightness, and pain in order to achieve a better more functional QOL.    Personal Factors and Comorbidities Comorbidity 2    Comorbidities HTN, Previous stroke    Examination-Activity Limitations Bend;Lift;Sit;Stand    Examination-Participation Restrictions Occupation;Other   Hobbies such as fishing and hunting   Stability/Clinical Decision Making Evolving/Moderate complexity    Rehab Potential Good    PT Frequency 2x / week    PT Duration 8 weeks    PT Treatment/Interventions Cryotherapy;Electrical Stimulation;Iontophoresis 4mg /ml Dexamethasone;Moist Heat;Traction;Gait training;Stair training;Functional mobility training;Therapeutic activities;Therapeutic exercise;Balance training;Neuromuscular re-education;Manual techniques;Passive range of motion;Dry needling;Spinal Manipulations;Joint Manipulations;Canalith Repostioning;Ultrasound;Patient/family education;Vestibular    PT Next Visit Plan continue with  progression of prone activities and lumbar central passive accessory mobility, add to HEP    PT Home Exercise Plan Access Code: [email or web address]    Consulted and Agree with Plan of Care Patient           Patient will benefit from skilled therapeutic intervention in order to improve the following deficits and impairments:  Abnormal gait,Hypomobility,Increased muscle spasms,Impaired sensation,Decreased range of motion,Decreased activity tolerance,Decreased strength,Pain  Visit Diagnosis: Chronic bilateral low back pain with bilateral sciatica     Problem List Patient Active Problem List   Diagnosis Date Noted  . 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 Chronic right-sided low back pain with right-sided sciatica 09/02/2019  . Abnormal ECG 10/30/2018  . Heart palpitations 10/30/2018  . SOB (shortness of breath) on exertion 10/30/2018  . DDD (degenerative disc disease), lumbar 06/13/2017  . Obesity (BMI 30.0-34.9) 06/13/2017  . Headache disorder 03/23/2015  . Cerebral infarction (HCC) 11/30/2012  . Tests ordered 11/30/2012  . Essential hypertension 07/06/2012  . Hypercholesterolemia 07/06/2012  . Snuff user 07/06/2012    Canesha Tesfaye 05/24/2020, 12:57 PM  [facility] [address] [place], [place], [place] Phone: [phone]   Fax:  [phone]  Name: Jerome [name] MRN: Ernestina Patches Date of Birth: October 22, 1975

## 2020-05-29 ENCOUNTER — Ambulatory Visit: Payer: BC Managed Care – PPO

## 2020-05-29 ENCOUNTER — Other Ambulatory Visit: Payer: Self-pay

## 2020-05-29 DIAGNOSIS — M5442 Lumbago with sciatica, left side: Secondary | ICD-10-CM | POA: Diagnosis not present

## 2020-05-29 DIAGNOSIS — G8929 Other chronic pain: Secondary | ICD-10-CM

## 2020-05-29 NOTE — Therapy (Signed)
[facility] [address] [place], [place], [address] Phone: [phone]   Fax:  [phone]  Physical Therapy Treatment  Patient Details  Name: Jerome [name] MRN: [id number] Date of Birth: [birth year] Referring Provider (PT): Dr. Filomena Jungling   Encounter Date: 05/29/2020   PT End of Session - 05/29/20 0812    Visit Number 5    Number of Visits 17    Date for PT Re-Evaluation 07/12/20    Authorization Type eval: 05/17/20    PT Start Time 0806    PT Stop Time 0845    PT Time Calculation (min) 39 min    Activity Tolerance Patient tolerated treatment well    Behavior During Therapy [facility] for tasks assessed/performed           Past Medical History:  Diagnosis Date  . Hyperlipidemia   . Hypertension   . Pneumothorax, spontaneous, tension 2001    History reviewed. No pertinent surgical history.  There were no vitals filed for this visit.   Subjective Assessment - 05/29/20 0810    Subjective Pt states that his back "gave out" on him this past Friday. He bent over to pick something up and his low back immediately seized up. He states that he had to sit down with heat on his back. His back pain has improved over the weekend but is still aggravated. He rates his pain currently as a 4/10. No specific questions upon arrival.    Pertinent History LBP started a couple of years ago when he sneezed.  Difficulties bending to the floor, numbness down left and right leg and tingling down thighs.  Ischial tuberosity bothering him after first injection.  Has trouble lying on back, more comfortable on stomach.  Has had about 5 injections and then wears off after a couple of days. Feels like his back is tired and a discomfort constantly.  Hasn't gone back to the gym since injury.  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  Lifting aggravates. 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 Mild to moderate spinal canal/neural foraminal narrowing at this level.  Mild left L2-3 and bilateral L3-4 neural foraminal narrowing.L5-S1 central and left subarticular protrusions with moderate left  and mild right neural foraminal narrowing.  Patient went through physical therapy last year [facility] and reports that it was helpful.    Limitations Sitting;Lifting;Standing    How long can you sit comfortably? 10 minutes at work, 45 minutes in recliner at home    How long can you stand comfortably? 20 minutes    How long can you walk comfortably? unknown    Diagnostic tests MRI, see history    Patient Stated Goals Getting back to routine at the gym    Currently in Pain? Yes    Pain Score 4     Pain Location Back    Pain Orientation Right;Left    Pain Descriptors / Indicators Tightness    Pain Type Chronic pain    Pain Onset More than a month ago    Pain Frequency Constant              TREATMENT    Electrical Stimulation  With patient in prone [place] applied to bilateral lower lumbar spine using Vect[employer] unit and default settings (2s ramp, continuous sweep, 100pps frequency,  cycle time 5/5) x 10 minutes at pt tolerated intensity of 225V bilaterally x 10 minutes. Moist heat pack applied concurrently with one layer between hot pack and skin. Skin inspection performed before and after therapy without any signs of irritation with the exception of typical mild redness from heat;    Ther-Ex NuStep (seat position 11, arms 11) L2 x , for warmup during subjective (3 minutes unbilled);  Hooklying ant/post tilt 5s hold x 10 each, tactile cues applied to help pt with understanding; Hooklying marches with transverse abdominis contraction x 10 BLE; Hooklying knee rocking for low back stretch 30s hold toward each side;   Manual Therapy CPA L1-L5, grade  II-III, 2x30s bouts each level; R UPA L1-L5, grade II-III, x30s bout each level; STM perform to bilateral lumbar paraspinals with Theraroller as well as shingling effleurage; Supine R single knee to chest stretch x 30s; Supine R piriformis stretch x 30s;   Pt educated throughout session about proper posture and technique with exercises. Improved exercise technique, movement at target joints, use of target muscles after min to mod verbal, visual, tactile cues.    Pt arrived reporting increase in back pain since Friday when he "threw out" his back after bending over to pick something up. Session today focused on pain control and decrease tone in lower lumbar paraspinals. Utilized HiVot estim to lumbar spine at start of session with concurrent moist heat pack. Continued with manual techniques to lumbar spine as well as hip and low back stretching.  Included light strengthening today for his back and hips but more strenuous exercises avoided to minimize aggravating low back. Patient reports improvement in his back pain at the end of the session. Patient encouraged to continue his HEP and follow-up as scheduled.  He will benefit from continued PT to address deficits in mobility, muscle tightness, and pain in order to achieve a better more functional QOL.                            PT Short Term Goals - 05/17/20 1553      PT SHORT TERM GOAL #1   Title Pt will be independent with HEP in order to improve strength and decrease back pain in order to improve pain-free function at home and work    Time 4    Period Weeks    Status New    Target Date 06/14/20             PT Long Term Goals - 05/17/20 1554      PT LONG TERM GOAL #1   Title Pt will decrease mODI score by at least 13 points in order demonstrate clinically significant reduction in back pain/disability.    Baseline 05/17/20: 40%    Time 8    Period Weeks    Status New    Target Date 07/12/20      PT LONG  TERM GOAL #2   Title Pt will decrease worst back pain as reported on NPRS by at least 2 points in order to demonstrate clinically significant reduction in back pain.    Baseline 05/17/20: 6/10    Time 8    Period Weeks    Status New    Target Date 07/12/20      PT LONG TERM GOAL #3   Title Pt will increase strength of by at least 1/2 MMT grade of hip extension and knee flexion bilaterally  in order to demonstrate improvement  in strength and function.    Baseline 05/17/20: L+R hip extensio: 4+/5, L+R knee flexion: 4+5    Time 8    Period Weeks    Status New    Target Date 07/12/20      PT LONG TERM GOAL #4   Title Patient improved FOTO score to at least 68 in order to demonstrate significant improvement in his function at home and work    Baseline 05/17/2020: 55    Time 8    Period Weeks    Status New    Target Date 07/12/20                 Plan - 05/29/20 0819    Clinical Impression Statement Pt arrived reporting increase in back pain since Friday when he "threw out" his back after bending over to pick something up. Session today focused on pain control and decrease tone in lower lumbar paraspinals. Utilized HiVot estim to lumbar spine at start of session with concurrent moist heat pack. Continued with manual techniques to lumbar spine as well as hip and low back stretching.  Included light strengthening today for his back and hips but more strenuous exercises avoided to minimize aggravating low back. Patient reports improvement in his back pain at the end of the session. Patient encouraged to continue his HEP and follow-up as scheduled.  He will benefit from continued PT to address deficits in mobility, muscle tightness, and pain in order to achieve a better more functional QOL.    Personal Factors and Comorbidities Comorbidity 2    Comorbidities HTN, Previous stroke    Examination-Activity Limitations Bend;Lift;Sit;Stand    Examination-Participation Restrictions Occupation;Other    Hobbies such as fishing and hunting   Stability/Clinical Decision Making Evolving/Moderate complexity    Rehab Potential Good    PT Frequency 2x / week    PT Duration 8 weeks    PT Treatment/Interventions Cryotherapy;Electrical Stimulation;Iontophoresis 4mg /ml Dexamethasone;Moist Heat;Traction;Gait training;Stair training;Functional mobility training;Therapeutic activities;Therapeutic exercise;Balance training;Neuromuscular re-education;Manual techniques;Passive range of motion;Dry needling;Spinal Manipulations;Joint Manipulations;Canalith Repostioning;Ultrasound;Patient/family education;Vestibular    PT Next Visit Plan continue with progression of prone activities and lumbar central passive accessory mobility, add to HEP    PT Home Exercise Plan Access Code: [email or web address]    Consulted and Agree with Plan of Care Patient           Patient will benefit from skilled therapeutic intervention in order to improve the following deficits and impairments:  Abnormal gait,Hypomobility,Increased muscle spasms,Impaired sensation,Decreased range of motion,Decreased activity tolerance,Decreased strength,Pain  Visit Diagnosis: Chronic bilateral low back pain with bilateral sciatica     Problem List Patient Active Problem List   Diagnosis Date Noted  . 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 Chronic right-sided low back pain with right-sided sciatica 09/02/2019  . Abnormal ECG 10/30/2018  . Heart palpitations 10/30/2018  . SOB (shortness of breath) on exertion 10/30/2018  . DDD (degenerative disc disease), lumbar 06/13/2017  . Obesity (BMI 30.0-34.9) 06/13/2017  . Headache disorder 03/23/2015  . Cerebral infarction (HCC) 11/30/2012  . Tests ordered 11/30/2012  . Essential hypertension 07/06/2012  . Hypercholesterolemia 07/06/2012  . Snuff user 07/06/2012   07/08/2012 PT, DPT, [facility]  Jerome [name] 05/29/2020, 1:34 PM  [place] [facility] [address] [place], [place], [place] Phone: [phone]   Fax:  [phone]  Name: Jerome [name] MRN: Ernestina Patches Date of Birth: 03/29/1975

## 2020-05-31 ENCOUNTER — Ambulatory Visit: Payer: BC Managed Care – PPO

## 2020-05-31 ENCOUNTER — Other Ambulatory Visit: Payer: Self-pay

## 2020-05-31 DIAGNOSIS — M5442 Lumbago with sciatica, left side: Secondary | ICD-10-CM | POA: Diagnosis not present

## 2020-05-31 DIAGNOSIS — G8929 Other chronic pain: Secondary | ICD-10-CM

## 2020-05-31 NOTE — Therapy (Signed)
[place] [facility] [address] [place], [place], [address] Phone: [phone]   Fax:  [phone]  Physical Therapy Treatment  Patient Details  Name: Jerome [name] MRN: [id number] Date of Birth: 26-Jul-1975 Referring Provider (PT): Dr. Filomena Jungling   Encounter Date: 05/31/2020   PT End of Session - 05/31/20 0804    Visit Number 6    Number of Visits 17    Date for PT Re-Evaluation 07/12/20    Authorization Type eval: 05/17/20    PT Start Time 0800    PT Stop Time 0844    PT Time Calculation (min) 44 min    Activity Tolerance Patient tolerated treatment well    Behavior During Therapy [facility] for tasks assessed/performed           Past Medical History:  Diagnosis Date  . Hyperlipidemia   . Hypertension   . Pneumothorax, spontaneous, tension 2001    History reviewed. No pertinent surgical history.  There were no vitals filed for this visit.   Subjective Assessment - 05/31/20 0803    Subjective Patient reported his back is not quite back to how it was feeling prior to this past friday, but is still improved.    Pertinent History LBP started a couple of years ago when he sneezed.  Difficulties bending to the floor, numbness down left and right leg and tingling down thighs.  Ischial tuberosity bothering him after first injection.  Has trouble lying on back, more comfortable on stomach.  Has had about 5 injections and then wears off after a couple of days. Feels like his back is tired and a discomfort constantly.  Hasn't gone back to the gym since injury.  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  Lifting aggravates. 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 Mild to moderate spinal canal/neural foraminal narrowing at this level.  Mild left L2-3 and  bilateral L3-4 neural foraminal narrowing.L5-S1 central and left subarticular protrusions with moderate left  and mild right neural foraminal narrowing.  Patient went through physical therapy last year [facility] and reports that it was helpful.    Limitations Sitting;Lifting;Standing    How long can you sit comfortably? 10 minutes at work, 45 minutes in recliner at home    How long can you stand comfortably? 20 minutes    How long can you walk comfortably? unknown    Diagnostic tests MRI, see history    Patient Stated Goals Getting back to routine at the gym    Currently in Pain? Yes    Pain Score 2     Pain Location Back    Pain Orientation Right;Left    Pain Descriptors / Indicators Tightness    Pain Type Chronic pain    Pain Onset More than a month ago             TREATMENT    Ther-Ex NuStep (seat position 11, arms 11) L2-3 x , for warmup during subjective (4 minutes unbilled);  Hooklying partial bridges x 10, improved range of motion without pain today; Hooklying, clams with manual resistance x 15; Hooklying adductor squeezes with manual resistance x 15; Hooklying SLR with transverse abdominis contraction x 15 BLE; Hamstring curls supine x15 Hooklying physioball holds in front of chest with therapist performing dynamic perturbations in all directions 30s x 2;  Supine R piriformis stretch 30s hold x 2; Squats with 10# buttocks touch to table x15   Manual Therapy Prone press ups with belt lock around L4/5 level 3 x 10, during final rep pt performs 10 oscillations at end range extension, pt denies any increase in pain; CPA L1-L5, grade III/IV, 2x30s bouts each level; Bilateral UPA L1-L5, grade III/IV, 2x30s bouts each level; Brief STM perform to bilateral lumbar paraspinals with Hypervolt;    Pt educated throughout session about proper posture and technique with exercises. Improved exercise technique, movement at target joints, use of target muscles  after min to mod verbal, visual, tactile cues.     Pt response/clinical impression: Pt with improved pain compared to previous session but still elevated compared to prior to exacerbation. Session focused on pain management as well as return to strengthening program as able. Pt able to complete all exercises without complaints of increased pain, and reported decreased pain at end of session. Overall the patient would benefit from further skilled PT intervention to maximize pain free function.       PT Education - 05/31/20 0804    Education Details therex    Person(s) Educated Patient    Methods Explanation;Handout    Comprehension Verbalized understanding            PT Short Term Goals - 05/17/20 1553      PT SHORT TERM GOAL #1   Title Pt will be independent with HEP in order to improve strength and decrease back pain in order to improve pain-free function at home and work    Time 4    Period Weeks    Status New    Target Date 06/14/20             PT Long Term Goals - 05/17/20 1554      PT LONG TERM GOAL #1   Title Pt will decrease mODI score by at least 13 points in order demonstrate clinically significant reduction in back pain/disability.    Baseline 05/17/20: 40%    Time 8    Period Weeks    Status New    Target Date 07/12/20      PT LONG TERM GOAL #2   Title Pt will decrease worst back pain as reported on NPRS by at least 2 points in order to demonstrate clinically significant reduction in back pain.    Baseline 05/17/20: 6/10    Time 8    Period Weeks    Status New    Target Date 07/12/20      PT LONG TERM GOAL #3   Title Pt will increase strength of by at least 1/2 MMT grade of hip extension and knee flexion bilaterally  in order to demonstrate improvement in strength and function.    Baseline 05/17/20: L+R hip extensio: 4+/5, L+R knee flexion: 4+5    Time 8    Period Weeks    Status New    Target Date 07/12/20      PT LONG TERM GOAL #4   Title Patient  improved FOTO score to at least 68 in order to demonstrate significant improvement in his function at home and work    Baseline 05/17/2020: 55    Time 8    Period Weeks    Status New    Target Date 07/12/20                 Plan - 05/31/20 0804    Clinical Impression Statement Pt with improved  pain compared to previous session but still elevated compared to prior to exacerbation. Session focused on pain management as well as return to strengthening program as able. Pt able to complete all exercises without complaints of increased pain, and reported decreased pain at end of session. Overall the patient would benefit from further skilled PT intervention to maximize pain free function.    Personal Factors and Comorbidities Comorbidity 2    Comorbidities HTN, Previous stroke    Examination-Activity Limitations Bend;Lift;Sit;Stand    Examination-Participation Restrictions Occupation;Other   Hobbies such as fishing and hunting   Stability/Clinical Decision Making Evolving/Moderate complexity    Rehab Potential Good    PT Frequency 2x / week    PT Duration 8 weeks    PT Treatment/Interventions Cryotherapy;Electrical Stimulation;Iontophoresis 4mg /ml Dexamethasone;Moist Heat;Traction;Gait training;Stair training;Functional mobility training;Therapeutic activities;Therapeutic exercise;Balance training;Neuromuscular re-education;Manual techniques;Passive range of motion;Dry needling;Spinal Manipulations;Joint Manipulations;Canalith Repostioning;Ultrasound;Patient/family education;Vestibular    PT Next Visit Plan continue with progression of prone activities and lumbar central passive accessory mobility, add to HEP    PT Home Exercise Plan Access Code: [email or web address]    Consulted and Agree with Plan of Care Patient           Patient will benefit from skilled therapeutic intervention in order to improve the following deficits and impairments:  Abnormal gait,Hypomobility,Increased muscle spasms,Impaired  sensation,Decreased range of motion,Decreased activity tolerance,Decreased strength,Pain  Visit Diagnosis: Chronic bilateral low back pain with bilateral sciatica     Problem List Patient Active Problem List   Diagnosis Date Noted  . 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 Chronic right-sided low back pain with right-sided sciatica 09/02/2019  . Abnormal ECG 10/30/2018  . Heart palpitations 10/30/2018  . SOB (shortness of breath) on exertion 10/30/2018  . DDD (degenerative disc disease), lumbar 06/13/2017  . Obesity (BMI 30.0-34.9) 06/13/2017  . Headache disorder 03/23/2015  . Cerebral infarction (HCC) 11/30/2012  . Tests ordered 11/30/2012  . Essential hypertension 07/06/2012  . Hypercholesterolemia 07/06/2012  . Snuff user 07/06/2012    07/08/2012 PT, DPT 10:26 AM,05/31/20   [facility] [address] [place], [place], [place] Phone: [phone]   Fax:  [phone]  Name: Jerome [name] MRN: Ernestina Patches Date of Birth: [birth year]

## 2020-06-05 ENCOUNTER — Other Ambulatory Visit: Payer: Self-pay

## 2020-06-05 ENCOUNTER — Ambulatory Visit: Payer: BC Managed Care – PPO

## 2020-06-05 DIAGNOSIS — M5441 Lumbago with sciatica, right side: Secondary | ICD-10-CM

## 2020-06-05 DIAGNOSIS — G8929 Other chronic pain: Secondary | ICD-10-CM

## 2020-06-05 DIAGNOSIS — M5442 Lumbago with sciatica, left side: Secondary | ICD-10-CM | POA: Diagnosis not present

## 2020-06-05 NOTE — Therapy (Signed)
[place] [facility] [address] [place], [place], [address] Phone: [phone]   Fax:  [phone]  Physical Therapy Treatment  Patient Details  Name: Jerome [name] MRN: [id number] Date of Birth: 02-20-1976 Referring Provider (PT): Dr. Filomena Jungling   Encounter Date: 06/05/2020   PT End of Session - 06/05/20 0819    Visit Number 7    Number of Visits 17    Date for PT Re-Evaluation 07/12/20    Authorization Type eval: 05/17/20    PT Start Time 0815    PT Stop Time 0855    PT Time Calculation (min) 40 min    Activity Tolerance Patient tolerated treatment well    Behavior During Therapy [facility] for tasks assessed/performed           Past Medical History:  Diagnosis Date  . Hyperlipidemia   . Hypertension   . Pneumothorax, spontaneous, tension 2001    History reviewed. No pertinent surgical history.  There were no vitals filed for this visit.   Subjective Assessment - 06/05/20 0816    Subjective Patient reports that he is doing well today.  His back pain is improving since his most recent flare.  He reports 2/10 at rest upon arrival today.  He continues his HEP but reports some difficulty performing his hip flexor stretch on the side of the bed due to the softness of his mattress.  Otherwise no specific questions or concerns upon arrival.    Pertinent History LBP started a couple of years ago when he sneezed.  Difficulties bending to the floor, numbness down left and right leg and tingling down thighs.  Ischial tuberosity bothering him after first injection.  Has trouble lying on back, more comfortable on stomach.  Has had about 5 injections and then wears off after a couple of days. Feels like his back is tired and a discomfort constantly.  Hasn't gone back to the gym since injury.  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  Lifting aggravates. 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 Mild to moderate spinal canal/neural foraminal narrowing at this level.  Mild left L2-3 and bilateral L3-4 neural foraminal narrowing.L5-S1 central and left subarticular protrusions with moderate left  and mild right neural foraminal narrowing.  Patient went through physical therapy last year [facility] and reports that it was helpful.    Limitations Sitting;Lifting;Standing    How long can you sit comfortably? 10 minutes at work, 45 minutes in recliner at home    How long can you stand comfortably? 20 minutes    How long can you walk comfortably? unknown    Diagnostic tests MRI, see history    Patient Stated Goals Getting back to routine at the gym    Currently in Pain? Yes    Pain Score 2     Pain Location Back    Pain Orientation Right;Left    Pain Descriptors / Indicators Tightness    Pain Type Chronic pain    Pain Onset More than a month ago    Pain Frequency Constant              TREATMENT    Ther-Ex SciFit L4 x , for warmup during subjective;  Prone bent and straight knee hip extension x 10 each bilaterally; Hooklying marches with transverse abdominis contraction  x 10 BLE; Hooklying transverse abdominis contraction with leg extensions x 10 BLE; Hooklying bridges with transverse abdominis contraction x 10; Hooklying ant/post tilt 5s hold x 10 each, tactile cues applied to help pt with understanding; Sit to stand with 7# dumbbell x 10; Box lifts from floor 12.5# x 10, demonstration of appropriate lifting technique; HEP instruction in runners hip flexor stretch performed 30s x 3;   Manual Therapy CPA L1-L5, grade II-III, 2x30s bouts each level; R UPA L1-L5, grade II-III, x30s bout each level; STM perform to bilateral lumbar paraspinals with Theragun; Supine R single knee to chest stretch x 30s; Supine R piriformis stretch x  30s; Supine R hip flexor stretch off edge of table x 30s;   Pt educated throughout session about proper posture and technique with exercises. Improved exercise technique, movement at target joints, use of target muscles after min to mod verbal, visual, tactile cues.    Patient demonstrates excellent motivation during session today.  Continued with manual techniques to lumbar spine as well as hip and low back stretching.  Removed Thomas test hip flexor stretch from HEP and instead added runners stretch for hip flexor tightness.  Patient reports he is able to feel a more significant stretch in the runners position.  Reintroduced additional strengthening today given the back pain has improved compared to previous sessions.  No reports of increase in back pain during session with the exception of some mild pain in right lower back during bridges.  Patient is making excellent progress towards his goals since his most recent setback from the back flare. Patient encouraged to continue his HEP and follow-up as scheduled.  He will benefit from continued PT to address deficits in mobility, muscle tightness, and pain in order to achieve a better more functional QOL.                             PT Short Term Goals - 05/17/20 1553      PT SHORT TERM GOAL #1   Title Pt will be independent with HEP in order to improve strength and decrease back pain in order to improve pain-free function at home and work    Time 4    Period Weeks    Status New    Target Date 06/14/20             PT Long Term Goals - 05/17/20 1554      PT LONG TERM GOAL #1   Title Pt will decrease mODI score by at least 13 points in order demonstrate clinically significant reduction in back pain/disability.    Baseline 05/17/20: 40%    Time 8    Period Weeks    Status New    Target Date 07/12/20      PT LONG TERM GOAL #2   Title Pt will decrease worst back pain as reported on NPRS by at least 2 points in  order to demonstrate clinically significant reduction in back pain.    Baseline 05/17/20: 6/10    Time 8    Period Weeks    Status New    Target Date 07/12/20      PT LONG TERM GOAL #3   Title Pt will increase strength of by at least 1/2 MMT grade of hip extension and knee flexion bilaterally  in order to demonstrate improvement in strength and function.    Baseline 05/17/20: L+R hip extensio: 4+/5, L+R knee flexion: 4+5  Time 8    Period Weeks    Status New    Target Date 07/12/20      PT LONG TERM GOAL #4   Title Patient improved FOTO score to at least 68 in order to demonstrate significant improvement in his function at home and work    Baseline 05/17/2020: 55    Time 8    Period Weeks    Status New    Target Date 07/12/20                 Plan - 06/05/20 0820    Clinical Impression Statement Patient demonstrates excellent motivation during session today.  Continued with manual techniques to lumbar spine as well as hip and low back stretching.  Removed Thomas test hip flexor stretch from HEP and instead added runners stretch for hip flexor tightness.  Patient reports he is able to feel a more significant stretch in the runners position.  Reintroduced additional strengthening today given the back pain has improved compared to previous sessions.  No reports of increase in back pain during session with the exception of some mild pain in right lower back during bridges.  Patient is making excellent progress towards his goals since his most recent setback from the back flare. Patient encouraged to continue his HEP and follow-up as scheduled.  He will benefit from continued PT to address deficits in mobility, muscle tightness, and pain in order to achieve a better more functional QOL.    Personal Factors and Comorbidities Comorbidity 2    Comorbidities HTN, Previous stroke    Examination-Activity Limitations Bend;Lift;Sit;Stand    Examination-Participation Restrictions Occupation;Other    Hobbies such as fishing and hunting   Stability/Clinical Decision Making Evolving/Moderate complexity    Rehab Potential Good    PT Frequency 2x / week    PT Duration 8 weeks    PT Treatment/Interventions Cryotherapy;Electrical Stimulation;Iontophoresis 4mg /ml Dexamethasone;Moist Heat;Traction;Gait training;Stair training;Functional mobility training;Therapeutic activities;Therapeutic exercise;Balance training;Neuromuscular re-education;Manual techniques;Passive range of motion;Dry needling;Spinal Manipulations;Joint Manipulations;Canalith Repostioning;Ultrasound;Patient/family education;Vestibular    PT Next Visit Plan continue with progression of prone activities and lumbar central passive accessory mobility, add to HEP    PT Home Exercise Plan Access Code: [email or web address]    Consulted and Agree with Plan of Care Patient           Patient will benefit from skilled therapeutic intervention in order to improve the following deficits and impairments:  Abnormal gait,Hypomobility,Increased muscle spasms,Impaired sensation,Decreased range of motion,Decreased activity tolerance,Decreased strength,Pain  Visit Diagnosis: Chronic bilateral low back pain with bilateral sciatica     Problem List Patient Active Problem List   Diagnosis Date Noted  . 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 Chronic right-sided low back pain with right-sided sciatica 09/02/2019  . Abnormal ECG 10/30/2018  . Heart palpitations 10/30/2018  . SOB (shortness of breath) on exertion 10/30/2018  . DDD (degenerative disc disease), lumbar 06/13/2017  . Obesity (BMI 30.0-34.9) 06/13/2017  . Headache disorder 03/23/2015  . Cerebral infarction (HCC) 11/30/2012  . Tests ordered 11/30/2012  . Essential hypertension 07/06/2012  . Hypercholesterolemia 07/06/2012  . Snuff user 07/06/2012   07/08/2012 PT, DPT, GCS  Naryiah Schley 06/05/2020, 10:09 AM  [place] [facility] [address]. [place], [place], [place] Phone: [phone]   Fax:  [phone]  Name: Jerome [name] MRN: Ernestina Patches Date of Birth: [birth year]

## 2020-06-07 ENCOUNTER — Ambulatory Visit: Payer: BC Managed Care – PPO

## 2020-06-07 ENCOUNTER — Other Ambulatory Visit: Payer: Self-pay

## 2020-06-07 DIAGNOSIS — M5442 Lumbago with sciatica, left side: Secondary | ICD-10-CM

## 2020-06-07 DIAGNOSIS — G8929 Other chronic pain: Secondary | ICD-10-CM

## 2020-06-07 NOTE — Therapy (Signed)
[place] [facility] [address] [place], [place], [address] Phone: [phone]   Fax:  [phone]  Physical Therapy Treatment  Patient Details  Name: Jerome [name] MRN: [id number] Date of Birth: 10-25-1975 Referring Provider (PT): Dr. Filomena Jungling   Encounter Date: 06/07/2020   PT End of Session - 06/07/20 0810    Visit Number 8    Number of Visits 17    Date for PT Re-Evaluation 07/12/20    Authorization Type eval: 05/17/20    PT Start Time 0805    PT Stop Time 0845    PT Time Calculation (min) 40 min    Activity Tolerance Patient tolerated treatment well    Behavior During Therapy [facility] for tasks assessed/performed           Past Medical History:  Diagnosis Date  . Hyperlipidemia   . Hypertension   . Pneumothorax, spontaneous, tension 2001    History reviewed. No pertinent surgical history.  There were no vitals filed for this visit.   Subjective Assessment - 06/07/20 0806    Subjective Patient reports that he is doing "alright" today. He thinks he might have done "too much" at work yesterday and is having some increased pain today. He reports 3.5/10 pain at rest upon arrival today.   Otherwise no specific questions or concerns upon arrival.    Pertinent History LBP started a couple of years ago when he sneezed.  Difficulties bending to the floor, numbness down left and right leg and tingling down thighs.  Ischial tuberosity bothering him after first injection.  Has trouble lying on back, more comfortable on stomach.  Has had about 5 injections and then wears off after a couple of days. Feels like his back is tired and a discomfort constantly.  Hasn't gone back to the gym since injury.  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  Lifting aggravates. 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  Inferiorly migrated right L4-5 paracentral  extrusion abutting the descending L5 nerve root. Mild to moderate spinal canal/neural foraminal narrowing at this level.  Mild left L2-3 and bilateral L3-4 neural foraminal narrowing.L5-S1 central and left subarticular protrusions with moderate left  and mild right neural foraminal narrowing.  Patient went through physical therapy last year [facility] and reports that it was helpful.    Limitations Sitting;Lifting;Standing    How long can you sit comfortably? 10 minutes at work, 45 minutes in recliner at home    How long can you stand comfortably? 20 minutes    How long can you walk comfortably? unknown    Diagnostic tests MRI, see history    Patient Stated Goals Getting back to routine at the gym    Currently in Pain? Yes    Pain Score 4     Pain Location Back    Pain Orientation Right;Left    Pain Descriptors / Indicators Tightness    Pain Type Chronic pain    Pain Onset More than a month ago                TREATMENT    Ther-Ex NuStep HIIT alternating between L2 and L6 in 30 second intervals each x , for warmup during subjective;  Hooklying marches with transverse abdominis contraction x 10 BLE; Hooklying ant/post tilt 5s hold x 10 each, tactile cues applied to help pt with understanding; Hooklying transverse abdominis contraction with  SLR x 10 BLE; Hooklying bridges with transverse abdominis contraction x 10; Hooklying bridges with transverse abdominis contraction and alternating LE lifts x 10 each; Sit to stand with 9# dumbbell x 10;   Manual Therapy CPA L1-L5, grade II-III, 2x30s bouts each level; RUPA L1-L5, grade II-III, x30s bout each level; STM perform to bilateral lumbar paraspinals with Theragun; Supine R single knee to chest stretch x 30s; Supine R piriformis stretch x 30s;   Pt educated throughout session about proper posture and technique with exercises. Improved exercise technique, movement at target joints, use of target muscles after  min to mod verbal, visual, tactile cues.    Patient demonstrates excellent motivation during session today.  Continued with manual techniques to lumbar spine as well as hip and low back stretching.  Continued with strengthening today and the only exercise that is mildly aggravating is reproduction of R lower back pain during bridges. He reports improved mobility and decreased general pain in low back at end of session. Patient is making excellent progress towards his goals and will continue to progress with additional lumbar and abdominal strengthening in follow-up sessions. Patient encouraged to continue his HEP and follow-up as scheduled. He will benefit from continued PT to address deficits in mobility, muscle tightness, and pain in order to achieve a better more functional QOL.                         PT Short Term Goals - 05/17/20 1553      PT SHORT TERM GOAL #1   Title Pt will be independent with HEP in order to improve strength and decrease back pain in order to improve pain-free function at home and work    Time 4    Period Weeks    Status New    Target Date 06/14/20             PT Long Term Goals - 05/17/20 1554      PT LONG TERM GOAL #1   Title Pt will decrease mODI score by at least 13 points in order demonstrate clinically significant reduction in back pain/disability.    Baseline 05/17/20: 40%    Time 8    Period Weeks    Status New    Target Date 07/12/20      PT LONG TERM GOAL #2   Title Pt will decrease worst back pain as reported on NPRS by at least 2 points in order to demonstrate clinically significant reduction in back pain.    Baseline 05/17/20: 6/10    Time 8    Period Weeks    Status New    Target Date 07/12/20      PT LONG TERM GOAL #3   Title Pt will increase strength of by at least 1/2 MMT grade of hip extension and knee flexion bilaterally  in order to demonstrate improvement in strength and function.    Baseline 05/17/20: L+R hip  extensio: 4+/5, L+R knee flexion: 4+5    Time 8    Period Weeks    Status New    Target Date 07/12/20      PT LONG TERM GOAL #4   Title Patient improved FOTO score to at least 68 in order to demonstrate significant improvement in his function at home and work    Baseline 05/17/2020: 55    Time 8    Period Weeks    Status New    Target Date 07/12/20  Plan - 06/07/20 0811    Clinical Impression Statement Patient demonstrates excellent motivation during session today.  Continued with manual techniques to lumbar spine as well as hip and low back stretching.  Continued with strengthening today and the only exercise that is mildly aggravating is reproduction of R lower back pain during bridges. He reports improved mobility and decreased general pain in low back at end of session. Patient is making excellent progress towards his goals and will continue to progress with additional lumbar and abdominal strengthening in follow-up sessions. Patient encouraged to continue his HEP and follow-up as scheduled.  He will benefit from continued PT to address deficits in mobility, muscle tightness, and pain in order to achieve a better more functional QOL.    Personal Factors and Comorbidities Comorbidity 2    Comorbidities HTN, Previous stroke    Examination-Activity Limitations Bend;Lift;Sit;Stand    Examination-Participation Restrictions Occupation;Other   Hobbies such as fishing and hunting   Stability/Clinical Decision Making Evolving/Moderate complexity    Rehab Potential Good    PT Frequency 2x / week    PT Duration 8 weeks    PT Treatment/Interventions Cryotherapy;Electrical Stimulation;Iontophoresis 4mg /ml Dexamethasone;Moist Heat;Traction;Gait training;Stair training;Functional mobility training;Therapeutic activities;Therapeutic exercise;Balance training;Neuromuscular re-education;Manual techniques;Passive range of motion;Dry needling;Spinal Manipulations;Joint  Manipulations;Canalith Repostioning;Ultrasound;Patient/family education;Vestibular    PT Next Visit Plan continue with progression of prone activities and lumbar central passive accessory mobility, add to HEP    PT Home Exercise Plan Access Code: [email or web address]    Consulted and Agree with Plan of Care Patient           Patient will benefit from skilled therapeutic intervention in order to improve the following deficits and impairments:  Abnormal gait,Hypomobility,Increased muscle spasms,Impaired sensation,Decreased range of motion,Decreased activity tolerance,Decreased strength,Pain  Visit Diagnosis: Chronic bilateral low back pain with bilateral sciatica     Problem List Patient Active Problem List   Diagnosis Date Noted  . 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 Chronic right-sided low back pain with right-sided sciatica 09/02/2019  . Abnormal ECG 10/30/2018  . Heart palpitations 10/30/2018  . SOB (shortness of breath) on exertion 10/30/2018  . DDD (degenerative disc disease), lumbar 06/13/2017  . Obesity (BMI 30.0-34.9) 06/13/2017  . Headache disorder 03/23/2015  . Cerebral infarction (HCC) 11/30/2012  . Tests ordered 11/30/2012  . Essential hypertension 07/06/2012  . Hypercholesterolemia 07/06/2012  . Snuff user 07/06/2012    07/08/2012 PT, DPT, GCS  Hidaya Daniel 06/07/2020, 12:54 PM  [place] [facility] [address]. [place], [place], [place] Phone: [phone]   Fax:  [phone]  Name: NIXON KOLTON MRN: Ernestina Patches Date of Birth: 12/06/1975

## 2020-06-09 NOTE — Patient Instructions (Addendum)
Access Code: [email or web address] URL: [email or web address] Date: 06/12/2020 Prepared by: Ria Comment  Exercises Supine Transversus Abdominis Bracing with Leg Extension - 2 x daily - 4 x weekly - 2 sets - 10 reps - 3s hold Supine Bridge - 2 x daily - 4 x weekly - 2 sets - 10 reps - 3s hold Prone Press Up - 2 x daily - 7 x weekly - 2 sets - 10 reps - 3s hold Supine Lower Trunk Rotation - 2 x daily - 7 x weekly - 3 reps - 30s hold Supine Piriformis Stretch with Foot on Ground - 2 x daily - 7 x weekly - 3 reps - 30s hold Supine Hip External Rotation Stretch - 2 x daily - 7 x weekly - 3 reps - 30s hold Half Kneeling Hip Flexor Stretch - 2 x daily - 7 x weekly - 3 reps - 30s hold Supine Pelvic Tilt - 2 x daily - 7 x weekly - 1 sets - 10 reps - 3s hold

## 2020-06-12 ENCOUNTER — Ambulatory Visit: Payer: BC Managed Care – PPO

## 2020-06-12 ENCOUNTER — Other Ambulatory Visit: Payer: Self-pay

## 2020-06-12 DIAGNOSIS — M5442 Lumbago with sciatica, left side: Secondary | ICD-10-CM | POA: Diagnosis not present

## 2020-06-12 DIAGNOSIS — M5441 Lumbago with sciatica, right side: Secondary | ICD-10-CM

## 2020-06-12 DIAGNOSIS — G8929 Other chronic pain: Secondary | ICD-10-CM

## 2020-06-12 NOTE — Therapy (Signed)
[facility] [address] [place], [place], [address] Phone: [phone]   Fax:  [phone]  Physical Therapy Treatment  Patient Details  Name: Jerome [name] MRN: [id number] Date of Birth: 07/31/1975 Referring Provider (PT): Dr. Filomena Jungling   Encounter Date: 06/12/2020   PT End of Session - 06/12/20 0804    Visit Number 9    Number of Visits 17    Date for PT Re-Evaluation 07/12/20    Authorization Type eval: 05/17/20    PT Start Time 0800    PT Stop Time 0845    PT Time Calculation (min) 45 min    Activity Tolerance Patient tolerated treatment well    Behavior During Therapy [facility] for tasks assessed/performed           Past Medical History:  Diagnosis Date  . Hyperlipidemia   . Hypertension   . Pneumothorax, spontaneous, tension 2001    History reviewed. No pertinent surgical history.  There were no vitals filed for this visit.   Subjective Assessment - 06/12/20 0800    Subjective Pt reports that he is doing well today. Complains of 3/10 pain upon arrival. States that he felt really good after his last therapy session however did have some soreness later that day. No specific questions or concerns upon arrival.    Pertinent History LBP started a couple of years ago when he sneezed.  Difficulties bending to the floor, numbness down left and right leg and tingling down thighs.  Ischial tuberosity bothering him after first injection.  Has trouble lying on back, more comfortable on stomach.  Has had about 5 injections and then wears off after a couple of days. Feels like his back is tired and a discomfort constantly.  Hasn't gone back to the gym since injury.  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  Lifting aggravates. 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  Inferiorly migrated right L4-5 paracentral extrusion abutting the  descending L5 nerve root. Mild to moderate spinal canal/neural foraminal narrowing at this level.  Mild left L2-3 and bilateral L3-4 neural foraminal narrowing.L5-S1 central and left subarticular protrusions with moderate left  and mild right neural foraminal narrowing.  Patient went through physical therapy last year [facility] and reports that it was helpful.    Limitations Sitting;Lifting;Standing    How long can you sit comfortably? 10 minutes at work, 45 minutes in recliner at home    How long can you stand comfortably? 20 minutes    How long can you walk comfortably? unknown    Diagnostic tests MRI, see history    Patient Stated Goals Getting back to routine at the gym    Currently in Pain? Yes    Pain Score 3     Pain Location Back    Pain Orientation Right;Left    Pain Descriptors / Indicators Tightness    Pain Type Chronic pain    Pain Onset More than a month ago    Pain Frequency Constant                 TREATMENT    Ther-Ex Prone bent and straight knee hip extension x 10 BLE, each; Quadruped cat camels for gentle ROM x 10 each direction; Quadruped alternating hip extension (kick backs) x 10 BLE, each; Quadruped bird dog (alternating shoulder flexion/contralateral hip extension with core muscles braced with verbal and tactile  cues provided) x 10 each side plus time for instruction, rest, and transition. Hooklying knee rocks with 3-5s hold each direction x 10 each; Hooklying lumbar rotation stretches x 30s to each side; Hooklying marches with transverse abdominis contraction x 10 BLE; Hooklying marches with transverse abdominis contraction with alternating arm lifts x 10 BLE; Hooklyingtransverse abdominis contractionwith SLR x 10 BLE; Hooklying bridges withtransverse abdominis contractionx 10; Hooklying bridges withtransverse abdominis contraction and alternating LE liftsx 10 each; Updated HEP to include pelvic tilts;   Manual Therapy CPA L1-L5,  grade II-III, 2x30s bouts each level; STM perform to bilateral lumbar paraspinals withTheragun;   Pt educated throughout session about proper posture and technique with exercises. Improved exercise technique, movement at target joints, use of target muscles after min to mod verbal, visual, tactile cues.    Patient demonstrates excellent motivation during session today.Continued with manual techniques to lumbar spine as well as hip and low back stretching however more time spent today focused on strengthening. Introduced new quadruped exercises during session today. Pt denies any pain during session today.Patient is making excellent progress towards his goals and will continue to progress with additional lumbar and abdominal strengthening in follow-up sessions. He will need updated outcome measures and goals at next visit to assess progress. Patient encouraged to continue his HEP and follow-up as scheduled. He will benefit from continued PT to address deficits in mobility, muscle tightness, and pain in order to achieve a better more functional QOL.                            PT Short Term Goals - 05/17/20 1553      PT SHORT TERM GOAL #1   Title Pt will be independent with HEP in order to improve strength and decrease back pain in order to improve pain-free function at home and work    Time 4    Period Weeks    Status New    Target Date 06/14/20             PT Long Term Goals - 05/17/20 1554      PT LONG TERM GOAL #1   Title Pt will decrease mODI score by at least 13 points in order demonstrate clinically significant reduction in back pain/disability.    Baseline 05/17/20: 40%    Time 8    Period Weeks    Status New    Target Date 07/12/20      PT LONG TERM GOAL #2   Title Pt will decrease worst back pain as reported on NPRS by at least 2 points in order to demonstrate clinically significant reduction in back pain.    Baseline 05/17/20: 6/10    Time 8     Period Weeks    Status New    Target Date 07/12/20      PT LONG TERM GOAL #3   Title Pt will increase strength of by at least 1/2 MMT grade of hip extension and knee flexion bilaterally  in order to demonstrate improvement in strength and function.    Baseline 05/17/20: L+R hip extensio: 4+/5, L+R knee flexion: 4+5    Time 8    Period Weeks    Status New    Target Date 07/12/20      PT LONG TERM GOAL #4   Title Patient improved FOTO score to at least 68 in order to demonstrate significant improvement in his function at home and work  Baseline 05/17/2020: 55    Time 8    Period Weeks    Status New    Target Date 07/12/20                 Plan - 06/12/20 0804    Clinical Impression Statement Patient demonstrates excellent motivation during session today.  Continued with manual techniques to lumbar spine as well as hip and low back stretching however more time spent today focused on strengthening. Introduced new quadruped exercises during session today. Pt denies any pain during session today. Patient is making excellent progress towards his goals and will continue to progress with additional lumbar and abdominal strengthening in follow-up sessions. He will need updated outcome measures and goals at next visit to assess progress. Patient encouraged to continue his HEP and follow-up as scheduled.  He will benefit from continued PT to address deficits in mobility, muscle tightness, and pain in order to achieve a better more functional QOL.    Personal Factors and Comorbidities Comorbidity 2    Comorbidities HTN, Previous stroke    Examination-Activity Limitations Bend;Lift;Sit;Stand    Examination-Participation Restrictions Occupation;Other   Hobbies such as fishing and hunting   Stability/Clinical Decision Making Evolving/Moderate complexity    Rehab Potential Good    PT Frequency 2x / week    PT Duration 8 weeks    PT Treatment/Interventions Cryotherapy;Electrical  Stimulation;Iontophoresis 4mg /ml Dexamethasone;Moist Heat;Traction;Gait training;Stair training;Functional mobility training;Therapeutic activities;Therapeutic exercise;Balance training;Neuromuscular re-education;Manual techniques;Passive range of motion;Dry needling;Spinal Manipulations;Joint Manipulations;Canalith Repostioning;Ultrasound;Patient/family education;Vestibular    PT Next Visit Plan Outcome measures and goals, continue with progression of prone activities and lumbar central passive accessory mobility as well as strengthening, update HEP as needed    PT Home Exercise Plan Access Code: [email or web address]    Consulted and Agree with Plan of Care Patient           Patient will benefit from skilled therapeutic intervention in order to improve the following deficits and impairments:  Abnormal gait,Hypomobility,Increased muscle spasms,Impaired sensation,Decreased range of motion,Decreased activity tolerance,Decreased strength,Pain  Visit Diagnosis: Chronic bilateral low back pain with bilateral sciatica     Problem List Patient Active Problem List   Diagnosis Date Noted  . 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 Chronic right-sided low back pain with right-sided sciatica 09/02/2019  . Abnormal ECG 10/30/2018  . Heart palpitations 10/30/2018  . SOB (shortness of breath) on exertion 10/30/2018  . DDD (degenerative disc disease), lumbar 06/13/2017  . Obesity (BMI 30.0-34.9) 06/13/2017  . Headache disorder 03/23/2015  . Cerebral infarction (HCC) 11/30/2012  . Tests ordered 11/30/2012  . Essential hypertension 07/06/2012  . Hypercholesterolemia 07/06/2012  . Snuff user 07/06/2012   07/08/2012 PT, DPT, GCS  Jayci Ellefson 06/12/2020, 9:55 AM  [facility] [address] [place], [place], [place] Phone: [phone]   Fax:  [phone]  Name: Jerome [name] MRN: [id number] Patches Date of Birth: [birth year]

## 2020-06-13 NOTE — Patient Instructions (Incomplete)
Progress Note  Update goals: mODI, FOTO, worst pain, LE strength   TREATMENT    Ther-Ex Prone bent and straight knee hip extension x 10 BLE, each; Quadruped cat camels for gentle ROM x 10 each direction; Quadruped alternating hip extension (kick backs) x 10 BLE, each; Quadruped bird dog (alternating shoulder flexion/contralateral hip extension with core muscles braced with verbal and tactile cues provided) x 10 each side plus time for instruction, rest, and transition. Hooklying knee rocks with 3-5s hold each direction x 10 each; Hooklying lumbar rotation stretches x 30s to each side; Hooklying marches with transverse abdominis contraction x 10 BLE; Hooklying marches with transverse abdominis contraction with alternating arm lifts x 10 BLE; Hooklyingtransverse abdominis contractionwithSLRx 10 BLE; Hooklying bridges withtransverse abdominis contractionx 10; Hooklying bridges withtransverse abdominis contractionand alternating LE liftsx 10each; Updated HEP to include pelvic tilts;   Manual Therapy CPA L1-L5, grade II-III, 2x30s bouts each level; STM perform to bilateral lumbar paraspinals withTheragun;   Pt educated throughout session about proper posture and technique with exercises. Improved exercise technique, movement at target joints, use of target muscles after min to mod verbal, visual, tactile cues.    Patient demonstrates excellent motivation during session today.Continued with manual techniques to lumbar spine as well as hip and low back stretching however more time spent today focused on strengthening. Introduced new quadruped exercises during session today. Pt denies any pain during session today.Patient is making excellent progress towards his [place] will continue to progress with additional lumbar and abdominal strengthening in follow-up sessions. He will need updated outcome measures and goals at next visit to assess progress.Patient encouraged to  continue his HEP and follow-up as scheduled. He will benefit from continued PT to address deficits in mobility, muscle tightness, and pain in order to achieve a better more functional QOL.

## 2020-06-14 ENCOUNTER — Other Ambulatory Visit: Payer: Self-pay

## 2020-06-14 ENCOUNTER — Ambulatory Visit: Payer: BC Managed Care – PPO

## 2020-06-14 DIAGNOSIS — G8929 Other chronic pain: Secondary | ICD-10-CM

## 2020-06-14 DIAGNOSIS — M5442 Lumbago with sciatica, left side: Secondary | ICD-10-CM

## 2020-06-14 NOTE — Therapy (Signed)
[facility] [address] [place], [place], [address] Phone: [phone]   Fax:  [phone]  Physical Therapy Progress Note   Dates of reporting period  05/17/20   to   06/14/20   Patient Details  Name: Jerome [name] MRN: [id number] Date of Birth: 11-12-1975 Referring Provider (PT): Dr. Girtha Hake   Encounter Date: 06/14/2020   PT End of Session - 06/14/20 0942    Visit Number 10    Number of Visits 17    Date for PT Re-Evaluation 07/12/20    Authorization Type eval: 05/17/20    PT Start Time 0759    PT Stop Time 0845    PT Time Calculation (min) 46 min    Activity Tolerance Patient tolerated treatment well    Behavior During Therapy [facility] for tasks assessed/performed           Past Medical History:  Diagnosis Date  . Hyperlipidemia   . Hypertension   . Pneumothorax, spontaneous, tension 2001    History reviewed. No pertinent surgical history.  There were no vitals filed for this visit.   Subjective Assessment - 06/14/20 0804    Subjective Pt reports that he is doing well today. Reports 2/10 low back pain upon arrival today. Overall he reports that he is improving since starting therapy and states about 30% improvement. No specific questions or concerns.    Pertinent History LBP started a couple of years ago when he sneezed.  Difficulties bending to the floor, numbness down left and right leg and tingling down thighs.  Ischial tuberosity bothering him after first injection.  Has trouble lying on back, more comfortable on stomach.  Has had about 5 injections and then wears off after a couple of days. Feels like his back is tired and a discomfort constantly.  Hasn't gone back to the gym since injury.  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  Lifting aggravates. 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  Inferiorly migrated right  L4-5 paracentral extrusion abutting the descending L5 nerve root. Mild to moderate spinal canal/neural foraminal narrowing at this level.  Mild left L2-3 and bilateral L3-4 neural foraminal narrowing.L5-S1 central and left subarticular protrusions with moderate left  and mild right neural foraminal narrowing.  Patient went through physical therapy last year [facility] and reports that it was helpful.    Limitations Sitting;Lifting;Standing    How long can you sit comfortably? 10 minutes at work, 45 minutes in [place] at home    How long can you stand comfortably? 20 minutes    How long can you walk comfortably? unknown    Diagnostic tests MRI, see history    Patient Stated Goals Getting back to routine at the gym    Currently in Pain? Yes    Pain Score 2     Pain Location Back    Pain Orientation Right;Left    Pain Descriptors / Indicators Tightness    Pain Type Chronic pain    Pain Onset More than a month ago    Pain Frequency Constant                 TREATMENT    Ther-Ex NuStep HIIT alternating between L2 and L6 x 45s each x 5:30 with warm-up and cool down at beginning and end, therapist monitoring patient's response and obtaining interval history;  Updated outcome measures with patient:  FOTO: 63 MODI: 26%  Worst pain: 4/10; 06/14/20: R/L hip extension: 5/5, knee flexion: 5/5 Prone bent and straight knee hip extension x 10 BLE, each; Hooklying bridges withtransverse abdominis contractionx 10; Hooklying bridges withtransverse abdominis contractionand alternating LE liftsx 10each; Sidelying clams with manual resistance x 10 BLE; Sidelying hip abduction with manual resistance x 10 BLE; Nautilus dead lifts 50# x 10; Box lifts from floor 30# 2 x 10; Single leg russian dead lifts without weight x 10 BLE;   Manual Therapy CPA L1-L5, grade II-III, 2x30s bouts each level; STM perform to bilateral lumbar paraspinals withTheragun;   Pt educated throughout  session about proper posture and technique with exercises. Improved exercise technique, movement at target joints, use of target muscles after min to mod verbal, visual, tactile cues.    Patient demonstrates excellent motivation during session today. Updated outcome measures and goals with patient today.  Patient's Modified Oswestry Disability Index score decreased from 40% at initial evaluation to 26% today.  His worst reported pain has decreased from 6/10 to 4/10 and his hip extension as well as knee flexion strength have both improved.  His FOTO score improved from 59 to 63.  Overall patient reports about 30% improvement of symptoms starting with therapy. Continued with manual techniques to lumbar spine as well as hip and low back strengthening. Pt denies any pain during session today except some intermittent tightness across his low back.Patient is making excellent progress towards his goalsand therapist will continue to progress additional lumbar and abdominal strengthening in follow-up sessions. Patient's condition has the potential to improve in response to therapy. Maximum improvement is yet to be obtained. The anticipated improvement is attainable and reasonable in a generally predictable time. Patient encouraged to continue his HEP and follow-up as scheduled. He will benefit from continued PT to address deficits in mobility, muscle tightness, and pain in order to achieve a better more functional QOL.                               PT Short Term Goals - 06/14/20 0943      PT SHORT TERM GOAL #1   Title Pt will be independent with HEP in order to improve strength and decrease back pain in order to improve pain-free function at home and work    Time 4    Period Weeks    Status On-going    Target Date 07/12/20             PT Long Term Goals - 06/14/20 0943      PT LONG TERM GOAL #1   Title Pt will decrease mODI score to less than 14% in order  demonstrate clinically significant reduction in back pain/disability.    Baseline 05/17/20: 40%; 06/14/20: 26%;    Time 8    Period Weeks    Status Revised    Target Date 08/09/20      PT LONG TERM GOAL #2   Title Pt will decrease worst back pain as reported on NPRS to no more than 2/10 in order to demonstrate clinically significant reduction in back pain.    Baseline 05/17/20: 6/10; 06/14/20: 4/10;    Time 8    Period Weeks    Status Revised    Target Date 08/09/20      PT LONG TERM GOAL #3   Title Pt will increase strength of by at least 1/2 MMT grade of hip extension and knee  flexion bilaterally  in order to demonstrate improvement in strength and function.    Baseline 05/17/20: L+R hip extension: 4+/5, L+R knee flexion: 4+5; 06/14/20: R/L hip extension: 5/5, knee flexion: 5/5    Time 8    Period Weeks    Status Achieved      PT LONG TERM GOAL #4   Title Patient improved FOTO score to at least 68 in order to demonstrate significant improvement in his function at home and work    Baseline 05/17/2020: 59; 06/14/20: 63    Time 8    Period Weeks    Status Partially Met    Target Date 08/09/20                 Plan - 06/14/20 0943    Clinical Impression Statement Patient demonstrates excellent motivation during session today.  Updated outcome measures and goals with patient today.  Patient's Modified Oswestry Disability Index score decreased from 40% at initial evaluation to 26% today.  His worst reported pain has decreased from 6/10 to 4/10 and his hip extension as well as knee flexion strength have both improved.  His FOTO score improved from 59 to 63.  Overall patient reports about 30% improvement of symptoms starting with therapy.  Continued with manual techniques to lumbar spine as well as hip and low back strengthening. Pt denies any pain during session today except some intermittent tightness across his low back. Patient is making excellent progress towards his goals and therapist  will continue to progress additional lumbar and abdominal strengthening in follow-up sessions. Patient's condition has the potential to improve in response to therapy. Maximum improvement is yet to be obtained. The anticipated improvement is attainable and reasonable in a generally predictable time. Patient encouraged to continue his HEP and follow-up as scheduled.  He will benefit from continued PT to address deficits in mobility, muscle tightness, and pain in order to achieve a better more functional QOL.    Personal Factors and Comorbidities Comorbidity 2    Comorbidities HTN, Previous stroke    Examination-Activity Limitations Bend;Lift;Sit;Stand    Examination-Participation Restrictions Occupation;Other   Hobbies such as fishing and hunting   Stability/Clinical Decision Making Evolving/Moderate complexity    Rehab Potential Good    PT Frequency 2x / week    PT Duration 8 weeks    PT Treatment/Interventions Cryotherapy;Electrical Stimulation;Iontophoresis 56m/ml Dexamethasone;Moist Heat;Traction;Gait training;Stair training;Functional mobility training;Therapeutic activities;Therapeutic exercise;Balance training;Neuromuscular re-education;Manual techniques;Passive range of motion;Dry needling;Spinal Manipulations;Joint Manipulations;Canalith Repostioning;Ultrasound;Patient/family education;Vestibular    PT Next Visit Plan continue with progression of prone activities and lumbar central passive accessory mobility as well as strengthening, update HEP as needed    PT Home Exercise Plan Access Code: [email or web address]    Consulted and Agree with Plan of Care Patient           Patient will benefit from skilled therapeutic intervention in order to improve the following deficits and impairments:  Abnormal gait,Hypomobility,Increased muscle spasms,Impaired sensation,Decreased range of motion,Decreased activity tolerance,Decreased strength,Pain  Visit Diagnosis: Chronic bilateral low back pain with bilateral  sciatica     Problem List Patient Active Problem List   Diagnosis Date Noted  . 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 Chronic right-sided low back pain with right-sided sciatica 09/02/2019  . Abnormal ECG 10/30/2018  . Heart palpitations 10/30/2018  . SOB (shortness of breath) on exertion 10/30/2018  . DDD (degenerative disc disease), lumbar 06/13/2017  . Obesity (BMI 30.0-34.9) 06/13/2017  . Headache disorder 03/23/2015  . [place]bral [place] ([place]  11/30/2012  . Tests ordered 11/30/2012  . Essential hypertension 07/06/2012  . Hypercholesterolemia 07/06/2012  . Snuff user 07/06/2012   Phillips Grout PT, DPT, GCS  Zebedee Segundo 06/14/2020, 12:02 PM  [facility] [address]. [place], [place], [address] Phone: [phone]   Fax:  [phone]  Name: DARIUS LUNDBERG MRN: [id number] Date of Birth: 05/29/1975

## 2020-06-16 NOTE — Patient Instructions (Incomplete)
TREATMENT    Ther-Ex NuStep HIIT alternating between L2 and L6 x 45s each x 5:30 with warm-up and cool down at beginning and end, therapist monitoring patient's response and obtaining interval history;  Pronebent and straight kneehip extension x 10 BLE,each; Hooklying bridges withtransverse abdominis contractionx 10; Hooklying bridges withtransverse abdominis contractionand alternating LE liftsx 10each; Sidelying clams with manual resistance x 10 BLE; Sidelying hip abduction with manual resistance x 10 BLE; Nautilus dead lifts 50# x 10; Box lifts from floor 30# 2 x 10; Single leg russian dead lifts without weight x 10 BLE;   Manual Therapy CPA L1-L5, grade II-III, 2x30s bouts each level; STM perform to bilateral lumbar paraspinals withTheragun;   Pt educated throughout session about proper posture and technique with exercises. Improved exercise technique, movement at target joints, use of target muscles after min to mod verbal, visual, tactile cues.    Patient demonstrates excellent motivation during session today. Updated outcome measures and goals with patient today.  Patient's Modified Oswestry Disability Index score decreased from 40% at initial evaluation to 26% today.  His worst reported pain has decreased from 6/10 to 4/10 and his hip extension as well as knee flexion strength have both improved.  His FOTO score improved from 59 to 63.  Overall patient reports about 30% improvement of symptoms starting with therapy. Continued with manual techniques to lumbar spine as well as hip and low back strengthening. Pt denies any pain during session today except some intermittent tightness across his low back.Patient is making excellent progress towards his goalsand therapist will continue to progress additional lumbar and abdominal strengthening in follow-up sessions.Patient's condition has the potential to improve in response to therapy. Maximum improvement is yet to be  obtained. The anticipated improvement is attainable and reasonable in a generally predictable time. Patient encouraged to continue his HEP and follow-up as scheduled. He will benefit from continued PT to address deficits in mobility, muscle tightness, and pain in order to achieve a better more functional QOL.

## 2020-06-19 ENCOUNTER — Ambulatory Visit: Payer: BC Managed Care – PPO

## 2020-06-19 DIAGNOSIS — G8929 Other chronic pain: Secondary | ICD-10-CM

## 2020-06-20 NOTE — Patient Instructions (Incomplete)
TREATMENT    Ther-Ex NuStep HIIT alternating between L2 and L6 x 45s each x 5:30 with warm-up and cool down at beginning and end, therapist monitoring patient's response and obtaining interval history;  Updated outcome measures with patient: FOTO: 63 MODI: 26%  Worst pain: 4/10; 06/14/20: R/L hip extension: 5/5, knee flexion: 5/5 Pronebent and straight kneehip extension x 10 BLE,each; Hooklying bridges withtransverse abdominis contractionx 10; Hooklying bridges withtransverse abdominis contractionand alternating LE liftsx 10each; Sidelying clams with manual resistance x 10 BLE; Sidelying hip abduction with manual resistance x 10 BLE; Nautilus dead lifts 50# x 10; Box lifts from floor 30# 2 x 10; Single leg russian dead lifts without weight x 10 BLE;   Manual Therapy CPA L1-L5, grade II-III, 2x30s bouts each level; STM perform to bilateral lumbar paraspinals withTheragun;   Pt educated throughout session about proper posture and technique with exercises. Improved exercise technique, movement at target joints, use of target muscles after min to mod verbal, visual, tactile cues.     Patient demonstrates excellent motivation during session today.Continued with manual techniques to lumbar spine as well as hip and low back stretching however more time spent today focused on strengthening. Introduced new quadruped exercises during session today. Pt denies any pain during session today.Patient is making excellent progress towards his [place] will continue to progress with additional lumbar and abdominal strengthening in follow-up sessions. He will need updated outcome measures and goals at next visit to assess progress.Patient encouraged to continue his HEP and follow-up as scheduled. He will benefit from continued PT to address deficits in mobility, muscle tightness, and pain in order to achieve a better more functional QOL.

## 2020-06-21 ENCOUNTER — Ambulatory Visit: Payer: BC Managed Care – PPO | Attending: Physical Medicine & Rehabilitation

## 2020-06-21 ENCOUNTER — Other Ambulatory Visit: Payer: Self-pay

## 2020-06-21 DIAGNOSIS — M5442 Lumbago with sciatica, left side: Secondary | ICD-10-CM | POA: Insufficient documentation

## 2020-06-21 DIAGNOSIS — G8929 Other chronic pain: Secondary | ICD-10-CM | POA: Insufficient documentation

## 2020-06-21 DIAGNOSIS — M5441 Lumbago with sciatica, right side: Secondary | ICD-10-CM | POA: Insufficient documentation

## 2020-06-21 NOTE — Therapy (Signed)
[facility] [address] [place], [place], [address] Phone: [phone]   Fax:  [phone]  Physical Therapy Treatment  Patient Details  Name: Jerome [name] MRN: [id number] Date of Birth: 1975/06/07 Referring Provider (PT): Dr. Girtha Hake   Encounter Date: 06/21/2020   PT End of Session - 06/21/20 0945    Visit Number 11    Number of Visits 17    Date for PT Re-Evaluation 07/12/20    Authorization Type eval: 05/17/20    PT Start Time 0801    PT Stop Time 0845    PT Time Calculation (min) 44 min    Activity Tolerance Patient tolerated treatment well    Behavior During Therapy [facility] for tasks assessed/performed           Past Medical History:  Diagnosis Date  . Hyperlipidemia   . Hypertension   . Pneumothorax, spontaneous, tension 2001    History reviewed. No pertinent surgical history.  There were no vitals filed for this visit.   Subjective Assessment - 06/21/20 0807    Subjective Pt reports that he is doing alright today. Reports 3/10 low back pain upon arrival today. He has an appointment upcoming to meet with neurosurgery and discuss options for his back. No specific concerns but he does feel like he doesn't understand his MRI findings enough to be able to talk to his neurosurgeon adequately.    Pertinent History LBP started a couple of years ago when he sneezed.  Difficulties bending to the floor, numbness down left and right leg and tingling down thighs.  Ischial tuberosity bothering him after first injection.  Has trouble lying on back, more comfortable on stomach.  Has had about 5 injections and then wears off after a couple of days. Feels like his back is tired and a discomfort constantly.  Hasn't gone back to the gym since injury.  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  Lifting aggravates. 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 Mild to moderate spinal canal/neural foraminal narrowing at this level.  Mild left L2-3 and bilateral L3-4 neural foraminal narrowing.L5-S1 central and left subarticular protrusions with moderate left  and mild right neural foraminal narrowing.  Patient went through physical therapy last year [facility] and reports that it was helpful.    Limitations Sitting;Lifting;Standing    How long can you sit comfortably? 10 minutes at work, 45 minutes in [place] at home    How long can you stand comfortably? 20 minutes    How long can you walk comfortably? unknown    Diagnostic tests MRI, see history    Patient Stated Goals Getting back to routine at the gym    Currently in Pain? Yes    Pain Score 3     Pain Location Back    Pain Orientation Right;Left    Pain Descriptors / Indicators Tightness    Pain Type Chronic pain    Pain Onset More than a month ago    Pain Frequency Constant               TREATMENT    Ther-Ex NuStep L2 x 5 minutes for warm-up during interval history (3 minutes unbilled); Extensive time taken to review spinal anatomy and MRI findings to discuss plan of care with respect to therapy and for  patient to be adequately educated to speak to his neurosurgeon at the upcoming appointment; Straight kneehip extension x 10 BLE,each; Hooklying bridges withtransverse abdominis contractionx 10; Hooklying clams with manual resistance x 10 BLE; Hooklying adductor squeeze with manual resistance x 10 BLE; Sidelying hip abduction with manual resistance 2 x 10 BLE;   Manual Therapy CPA L1-L5, grade II-III, 2x30s bouts each level; STM perform to bilateral lumbar paraspinals withTheragun;   Pt educated throughout session about proper posture and technique with exercises. Improved exercise technique, movement at target joints, use of target muscles after min to mod  verbal, visual, tactile cues.    Patient demonstrates excellent motivation during session today.Continued with manual techniques to lumbar spine as well as hip and low back strengthening. Overall he has made good progress toward his goals. Extensive time taken today to review spinal anatomy and MRI findings to discuss plan of care with respect to therapy and for patient to be adequately educated to speak to his neurosurgeon at his  upcoming appointment. Pt denies any increase in pain during session today.He is making excellent progress towards his [place]ie [place] will continue to progress with additional lumbar and abdominal strengthening at follow-up sessions.Patient encouraged to continue his HEP and follow-up as scheduled. He will benefit from continued PT to address deficits in mobility, muscle tightness, and pain in order to achieve a better more functional QOL.                          PT Short Term Goals - 06/14/20 0943      PT SHORT TERM GOAL #1   Title Pt will be independent with HEP in order to improve strength and decrease back pain in order to improve pain-free function at home and work    Time 4    Period Weeks    Status On-going    Target Date 07/12/20             PT Long Term Goals - 06/14/20 0943      PT LONG TERM GOAL #1   Title Pt will decrease mODI score to less than 14% in order demonstrate clinically significant reduction in back pain/disability.    Baseline 05/17/20: 40%; 06/14/20: 26%;    Time 8    Period Weeks    Status Revised    Target Date 08/09/20      PT LONG TERM GOAL #2   Title Pt will decrease worst back pain as reported on NPRS to no more than 2/10 in order to demonstrate clinically significant reduction in back pain.    Baseline 05/17/20: 6/10; 06/14/20: 4/10;    Time 8    Period Weeks    Status Revised    Target Date 08/09/20      PT LONG TERM GOAL #3   Title Pt will increase strength of by at least 1/2 MMT grade of hip  extension and knee flexion bilaterally  in order to demonstrate improvement in strength and function.    Baseline 05/17/20: L+R hip extension: 4+/5, L+R knee flexion: 4+5; 06/14/20: R/L hip extension: 5/5, knee flexion: 5/5    Time 8    Period Weeks    Status Achieved      PT LONG TERM GOAL #4   Title Patient improved FOTO score to at least 68 in order to demonstrate significant improvement in his function at home and work    Baseline 05/17/2020: 59; 06/14/20: 63    Time 8  Period Weeks    Status Partially Met    Target Date 08/09/20                 Plan - 06/21/20 0943    Clinical Impression Statement Patient demonstrates excellent motivation during session today.  Continued with manual techniques to lumbar spine as well as hip and low back strengthening. Overall he has made good progress toward his goals. Extensive time taken today to review spinal anatomy and MRI findings to discuss plan of care with respect to therapy and for patient to be adequately educated to speak to his neurosurgeon at his  upcoming appointment. Pt denies any increase in pain during session today. He is making excellent progress towards his goals and will continue to progress with additional lumbar and abdominal strengthening at follow-up sessions. Patient encouraged to continue his HEP and follow-up as scheduled.  He will benefit from continued PT to address deficits in mobility, muscle tightness, and pain in order to achieve a better more functional QOL.    Personal Factors and Comorbidities Comorbidity 2    Comorbidities HTN, Previous stroke    Examination-Activity Limitations Bend;Lift;Sit;Stand    Examination-Participation Restrictions Occupation;Other   Hobbies such as fishing and hunting   Stability/Clinical Decision Making Evolving/Moderate complexity    Rehab Potential Good    PT Frequency 2x / week    PT Duration 8 weeks    PT Treatment/Interventions Cryotherapy;Electrical Stimulation;Iontophoresis  23m/ml Dexamethasone;Moist Heat;Traction;Gait training;Stair training;Functional mobility training;Therapeutic activities;Therapeutic exercise;Balance training;Neuromuscular re-education;Manual techniques;Passive range of motion;Dry needling;Spinal Manipulations;Joint Manipulations;Canalith Repostioning;Ultrasound;Patient/family education;Vestibular    PT Next Visit Plan continue with progression of prone activities and lumbar central passive accessory mobility as well as strengthening, update HEP as needed    PT Home Exercise Plan Access Code: [email or web address]    Consulted and Agree with Plan of Care Patient           Patient will benefit from skilled therapeutic intervention in order to improve the following deficits and impairments:  Abnormal gait,Hypomobility,Increased muscle spasms,Impaired sensation,Decreased range of motion,Decreased activity tolerance,Decreased strength,Pain  Visit Diagnosis: Chronic bilateral low back pain with bilateral sciatica     Problem List Patient Active Problem List   Diagnosis Date Noted  . 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 Chronic right-sided low back pain with right-sided sciatica 09/02/2019  . Abnormal ECG 10/30/2018  . Heart palpitations 10/30/2018  . SOB (shortness of breath) on exertion 10/30/2018  . DDD (degenerative disc disease), lumbar 06/13/2017  . Obesity (BMI 30.0-34.9) 06/13/2017  . Headache disorder 03/23/2015  . Cerebral infarction ([place] 11/30/2012  . Tests ordered 11/30/2012  . Essential hypertension 07/06/2012  . Hypercholesterolemia 07/06/2012  . Snuff user 07/06/2012   JPhillips GroutPT, DPT, GCS  Jerome [name] 06/21/2020, 9:46 AM   [facility] [place] [address]Phone: [phone]  Fax:  [phone] Name: Jerome BERRIOSMRN: [id number] of Birth: 112/28/1977

## 2020-06-22 NOTE — Patient Instructions (Signed)
TREATMENT    Ther-Ex NuStep L2 x 5 minutes for warm-up during interval history (3 minutes unbilled); Extensive time taken to review spinal anatomy and MRI findings to discuss plan of care with respect to therapy and for patient to be adequately educated to speak to his neurosurgeon at the upcoming appointment; Straight kneehip extension x 10 BLE,each; Hooklying bridges withtransverse abdominis contractionx 10; Hooklying clams with manual resistance x 10 BLE; Hooklying adductor squeeze with manual resistance x 10 BLE; Sidelying hip abduction with manual resistance 2 x 10 BLE;   Manual Therapy CPA L1-L5, grade II-III, 2x30s bouts each level; STM perform to bilateral lumbar paraspinals withTheragun;   Pt educated throughout session about proper posture and technique with exercises. Improved exercise technique, movement at target joints, use of target muscles after min to mod verbal, visual, tactile cues.    Patient demonstrates excellent motivation during session today.Continued with manual techniques to lumbar spine as well as hip and low back strengthening. Overall he has made good progress toward his goals. Extensive time taken today to review spinal anatomy and MRI findings to discuss plan of care with respect to therapy and for patient to be adequately educated to speak to his neurosurgeon at his  upcoming appointment. Pt denies any increase in pain during session today.He is making excellent progress towards his [place] will continue to progress with additional lumbar and abdominal strengthening at follow-up sessions.Patient encouraged to continue his HEP and follow-up as scheduled. He will benefit from continued PT to address deficits in mobility, muscle tightness, and pain in order to achieve a better more functional QOL.

## 2020-06-26 ENCOUNTER — Encounter: Payer: Self-pay | Admitting: Physical Therapy

## 2020-06-26 ENCOUNTER — Ambulatory Visit: Payer: BC Managed Care – PPO | Admitting: Physical Therapy

## 2020-06-26 ENCOUNTER — Other Ambulatory Visit: Payer: Self-pay

## 2020-06-26 DIAGNOSIS — M5442 Lumbago with sciatica, left side: Secondary | ICD-10-CM | POA: Diagnosis not present

## 2020-06-26 DIAGNOSIS — G8929 Other chronic pain: Secondary | ICD-10-CM

## 2020-06-26 NOTE — Therapy (Signed)
[facility] [address] [place], [place], [address] Phone: [phone]   Fax:  [phone]  Physical Therapy Treatment  Patient Details  Name: Jerome [name] MRN: [id number] Date of Birth: 12/25/1975 Referring Provider (PT): Dr. Girtha Hake   Encounter Date: 06/26/2020   PT End of Session - 06/26/20 0818    Visit Number 12    Number of Visits 17    Date for PT Re-Evaluation 07/12/20    Authorization Type eval: 05/17/20    PT Start Time 0802    PT Stop Time 0845    PT Time Calculation (min) 43 min    Activity Tolerance Patient tolerated treatment well    Behavior During Therapy [facility] for tasks assessed/performed           Past Medical History:  Diagnosis Date  . Hyperlipidemia   . Hypertension   . Pneumothorax, spontaneous, tension 2001    History reviewed. No pertinent surgical history.  There were no vitals filed for this visit.   Subjective Assessment - 06/26/20 0807    Subjective Patient states that he had his follow-up with neurosurgery. Patient notes 3/10 low back pain. He notes MD said next steps are up to the patient. No specific/new concerns mentioned.    Pertinent History LBP started a couple of years ago when he sneezed.  Difficulties bending to the floor, numbness down left and right leg and tingling down thighs.  Ischial tuberosity bothering him after first injection.  Has trouble lying on back, more comfortable on stomach.  Has had about 5 injections and then wears off after a couple of days. Feels like his back is tired and a discomfort constantly.  Hasn't gone back to the gym since injury.  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  Lifting aggravates. 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 Mild to moderate spinal  canal/neural foraminal narrowing at this level.  Mild left L2-3 and bilateral L3-4 neural foraminal narrowing.L5-S1 central and left subarticular protrusions with moderate left  and mild right neural foraminal narrowing.  Patient went through physical therapy last [facility] and reports that it was helpful.    Limitations Sitting;Lifting;Standing    How long can you sit comfortably? 10 minutes at work, 45 minutes in [place] at home    How long can you stand comfortably? 20 minutes    How long can you walk comfortably? unknown    Diagnostic tests MRI, see history    Patient Stated Goals Getting back to routine at the gym    Currently in Pain? Yes    Pain Score 3     Pain Location Back    Pain Orientation Left;Right    Pain Descriptors / Indicators Tightness    Pain Onset More than a month ago           TREATMENT  Therapeutic Exercise NuStep L2 x 5 minutes for warm-up during subjective (3 minutes unbilled); Prone straight kneehip extension 2 x 10 BLE; Hooklying bridges withtransverse abdominis contraction 2x 10; Hooklying clams with black theraband resistance 2 x 10 BLE; Hooklying adductor squeeze with ball  2 x 10 BLE; Sidelying hip abduction 2 x 10 BLE; Lateral step out with blue theraband, 2x10 B Pallof press with blue theraband, 2x10 B   Manual Therapy CPA L1-L5, grade II-III,  2x30s bouts each level; STM perform to bilateral lumbar paraspinals with vibratory and percussive device;   Pt educated throughout session about proper posture and technique with exercises. Improved exercise technique, movement at target joints, use of target muscles after min to mod verbal, visual, tactile cues.   ASSESSMENT Patient demonstrates excellent motivation during session today.Patient continues to demonstrate deficits with respect to postural endurance/strength and low back pain. Patient able to tolerate increased repetitions of exercises as well as new standing trunk  stabilization activities with report of decreased pain.Patient expressing some indication that he is interested in pursuing surgical interventions, but had not proceeded to schedule. Patient will benefit from continued PT to address deficits in mobility, muscle tightness, and pain in order to achieve a better, more functional QOL.       PT Short Term Goals - 06/14/20 0943      PT SHORT TERM GOAL #1   Title Pt will be independent with HEP in order to improve strength and decrease back pain in order to improve pain-free function at home and work    Time 4    Period Weeks    Status On-going    Target Date 07/12/20             PT Long Term Goals - 06/14/20 0943      PT LONG TERM GOAL #1   Title Pt will decrease mODI score to less than 14% in order demonstrate clinically significant reduction in back pain/disability.    Baseline 05/17/20: 40%; 06/14/20: 26%;    Time 8    Period Weeks    Status Revised    Target Date 08/09/20      PT LONG TERM GOAL #2   Title Pt will decrease worst back pain as reported on NPRS to no more than 2/10 in order to demonstrate clinically significant reduction in back pain.    Baseline 05/17/20: 6/10; 06/14/20: 4/10;    Time 8    Period Weeks    Status Revised    Target Date 08/09/20      PT LONG TERM GOAL #3   Title Pt will increase strength of by at least 1/2 MMT grade of hip extension and knee flexion bilaterally  in order to demonstrate improvement in strength and function.    Baseline 05/17/20: L+R hip extension: 4+/5, L+R knee flexion: 4+5; 06/14/20: R/L hip extension: 5/5, knee flexion: 5/5    Time 8    Period Weeks    Status Achieved      PT LONG TERM GOAL #4   Title Patient improved FOTO score to at least 68 in order to demonstrate significant improvement in his function at home and work    Baseline 05/17/2020: 59; 06/14/20: 63    Time 8    Period Weeks    Status Partially Met    Target Date 08/09/20                 Plan - 06/26/20  0851    Clinical Impression Statement Patient demonstrates excellent motivation during session today. Patient continues to demonstrate deficits with respect to postural endurance/strength and low back pain. Patient able to tolerate increased repetitions of exercises as well as new standing trunk stabilization activities with report of decreased pain. Patient expressing some indication that he is interested in pursuing surgical interventions, but had not proceeded to schedule. Patient will benefit from continued PT to address deficits in mobility, muscle tightness, and pain in order to achieve a better,  more functional QOL.    Personal Factors and Comorbidities Comorbidity 2    Comorbidities HTN, Previous stroke    Examination-Activity Limitations Bend;Lift;Sit;Stand    Examination-Participation Restrictions Occupation;Other   Hobbies such as fishing and hunting   Stability/Clinical Decision Making Evolving/Moderate complexity    Rehab Potential Good    PT Frequency 2x / week    PT Duration 8 weeks    PT Treatment/Interventions Cryotherapy;Electrical Stimulation;Iontophoresis 73m/ml Dexamethasone;Moist Heat;Traction;Gait training;Stair training;Functional mobility training;Therapeutic activities;Therapeutic exercise;Balance training;Neuromuscular re-education;Manual techniques;Passive range of motion;Dry needling;Spinal Manipulations;Joint Manipulations;Canalith Repostioning;Ultrasound;Patient/family education;Vestibular    PT Next Visit Plan continue with progression of prone activities and lumbar central passive accessory mobility as well as strengthening, update HEP as needed    PT Home Exercise Plan Access Code: [email or web address]    Consulted and Agree with Plan of Care Patient           Patient will benefit from skilled therapeutic intervention in order to improve the following deficits and impairments:  Abnormal gait,Hypomobility,Increased muscle spasms,Impaired sensation,Decreased range of  motion,Decreased activity tolerance,Decreased strength,Pain  Visit Diagnosis: Chronic bilateral low back pain with bilateral sciatica     Problem List Patient Active Problem List   Diagnosis Date Noted  . 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 Chronic right-sided low back pain with right-sided sciatica 09/02/2019  . Abnormal ECG 10/30/2018  . Heart palpitations 10/30/2018  . SOB (shortness of breath) on exertion 10/30/2018  . DDD (degenerative disc disease), lumbar 06/13/2017  . Obesity (BMI 30.0-34.9) 06/13/2017  . Headache disorder 03/23/2015  . Cerebral infarction ([place] 11/30/2012  . Tests ordered 11/30/2012  . Essential hypertension 07/06/2012  . Hypercholesterolemia 07/06/2012  . Snuff user 07/06/2012   KMyles GipPT, DPT #[id number] 06/26/2020, 8:52 AM  [place] [facility]M[place] [address]Phone: [phone]  Fax:  [phone] Name: Jerome HORNBACKMRN: [id number] of Birth: [birth year]

## 2020-06-27 NOTE — Patient Instructions (Addendum)
Access Code: [email or web address] URL: [email or web address] Date: 06/28/2020 Prepared by: Ria Comment  Exercises Supine Lower Trunk Rotation - 2 x daily - 7 x weekly - 3 reps - 30s hold Supine Piriformis Stretch with Foot on Ground - 2 x daily - 7 x weekly - 3 reps - 30s hold Supine Hip External Rotation Stretch - 2 x daily - 7 x weekly - 3 reps - 30s hold Half Kneeling Hip Flexor Stretch - 2 x daily - 7 x weekly - 3 reps - 30s hold Supine Transversus Abdominis Bracing with Leg Extension - 1 x daily - 4 x weekly - 2 sets - 10 reps - 3s hold Supine Bridge - 1 x daily - 4 x weekly - 2 sets - 10 reps - 3s hold Bird Dog - 1 x daily - 7 x weekly - 2 sets - 10 reps - 3s hold Plank on Knees - 1 x daily - 7 x weekly - 1 sets - 5 reps - 15-30s hold

## 2020-06-28 ENCOUNTER — Ambulatory Visit: Payer: BC Managed Care – PPO

## 2020-06-28 ENCOUNTER — Other Ambulatory Visit: Payer: Self-pay

## 2020-06-28 DIAGNOSIS — M5442 Lumbago with sciatica, left side: Secondary | ICD-10-CM | POA: Diagnosis not present

## 2020-06-28 DIAGNOSIS — G8929 Other chronic pain: Secondary | ICD-10-CM

## 2020-06-28 NOTE — Therapy (Signed)
[facility] [address] [place], [place], [address] Phone: [phone]   Fax:  [phone]  Physical Therapy Treatment  Patient Details  Name: Jerome [name] MRN: [id number] Date of Birth: 1975/12/13 Referring Provider (PT): Dr. Girtha Hake   Encounter Date: 06/28/2020   PT End of Session - 06/28/20 0810    Visit Number 13    Number of Visits 17    Date for PT Re-Evaluation 07/12/20    Authorization Type eval: 05/17/20    PT Start Time 0805    PT Stop Time 9038    PT Time Calculation (min) 42 min    Activity Tolerance Patient tolerated treatment well    Behavior During Therapy [facility] for tasks assessed/performed           Past Medical History:  Diagnosis Date  . Hyperlipidemia   . Hypertension   . Pneumothorax, spontaneous, tension 2001    History reviewed. No pertinent surgical history.  There were no vitals filed for this visit.   Subjective Assessment - 06/28/20 0811    Subjective Patient states that he had his follow-up with neurosurgery and he thinks he is going to schedule the microdiscectomy. He had to mow his grass and now his back pain is around 4/10 upon arrival. No specific/new concerns mentioned.    Pertinent History LBP started a couple of years ago when he sneezed.  Difficulties bending to the floor, numbness down left and right leg and tingling down thighs.  Ischial tuberosity bothering him after first injection.  Has trouble lying on back, more comfortable on stomach.  Has had about 5 injections and then wears off after a couple of days. Feels like his back is tired and a discomfort constantly.  Hasn't gone back to the gym since injury.  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  Lifting aggravates. 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  Inferiorly migrated right L4-5 paracentral extrusion abutting the descending  L5 nerve root. Mild to moderate spinal canal/neural foraminal narrowing at this level.  Mild left L2-3 and bilateral L3-4 neural foraminal narrowing.L5-S1 central and left subarticular protrusions with moderate left  and mild right neural foraminal narrowing.  Patient went through physical therapy last year [facility] and reports that it was helpful.    Limitations Sitting;Lifting;Standing    How long can you sit comfortably? 10 minutes at work, 45 minutes in [place] at home    How long can you stand comfortably? 20 minutes    How long can you walk comfortably? unknown    Diagnostic tests MRI, see history    Patient Stated Goals Getting back to routine at the gym    Currently in Pain? Yes    Pain Score 4     Pain Location Back    Pain Orientation Right;Left    Pain Descriptors / Indicators Tightness    Pain Type Chronic pain    Pain Onset More than a month ago    Pain Frequency Constant               TREATMENT    Therapeutic Exercise SciFit L4 x 5 minutes for warm-up during subjective; Prone straight and bent kneehip extension 2 x 10 BLE; Supine SLR with transverse abdominis contraction x 10 BLE; Hooklying bridges single leg bridge withtransverse abdominis contractionx 10 BLE; Quadruped alternating kick backs with transverse abdominis contraction x 10 BLE;  Quadruped Higher education careers adviser with transverse abdominis contraction x 10 BLE; Quadruped bird dogs with transverse abdominis contraction x 10 BLE; Prone knee planks 15s on/15s off x 5; Pallof press with Nautilus 50# x 10 each direction; Lateral step out with blue theraband 1 minute x 2; Education provided regarding post-surgery discharge expectations as well as HEP progression.   Manual Therapy CPA L1-L5, grade II-III, 2x30s bouts each level; STM perform to bilateral lumbar paraspinals with vibratory and percussive device;   Pt educated throughout session about proper posture and technique with exercises.  Improved exercise technique, movement at target joints, use of target muscles after min to mod verbal, visual, tactile cues.    Patient demonstrates excellent motivation during session today.Patient continues to demonstrate deficits with respect to postural endurance/strength and low back pain. Progressed exercises today to include quadruped strengthening as well as prone planks. Repeated standing pallof press and lateral step-outs. Pt provided education regarding possible discharge instructions for planning purposes but advised to follow all discharge information provided by his surgeon. Also recommended the book "Your Nerves are Having Back Surgery," by Margarita Sermons. Progressed HEP today to include additional strengthening. Patient will benefit from continued PT to address deficits in mobility, muscle tightness, and pain in order to achieve a better, more functional QOL.                          PT Short Term Goals - 06/14/20 0943      PT SHORT TERM GOAL #1   Title Pt will be independent with HEP in order to improve strength and decrease back pain in order to improve pain-free function at home and work    Time 4    Period Weeks    Status On-going    Target Date 07/12/20             PT Long Term Goals - 06/14/20 0943      PT LONG TERM GOAL #1   Title Pt will decrease mODI score to less than 14% in order demonstrate clinically significant reduction in back pain/disability.    Baseline 05/17/20: 40%; 06/14/20: 26%;    Time 8    Period Weeks    Status Revised    Target Date 08/09/20      PT LONG TERM GOAL #2   Title Pt will decrease worst back pain as reported on NPRS to no more than 2/10 in order to demonstrate clinically significant reduction in back pain.    Baseline 05/17/20: 6/10; 06/14/20: 4/10;    Time 8    Period Weeks    Status Revised    Target Date 08/09/20      PT LONG TERM GOAL #3   Title Pt will increase strength of by at least 1/2 MMT grade of hip  extension and knee flexion bilaterally  in order to demonstrate improvement in strength and function.    Baseline 05/17/20: L+R hip extension: 4+/5, L+R knee flexion: 4+5; 06/14/20: R/L hip extension: 5/5, knee flexion: 5/5    Time 8    Period Weeks    Status Achieved      PT LONG TERM GOAL #4   Title Patient improved FOTO score to at least 68 in order to demonstrate significant improvement in his function at home and work    Baseline 05/17/2020: 59; 06/14/20: 63    Time 8    Period Weeks    Status Partially Met    Target Date 08/09/20  Plan - 06/28/20 0811    Clinical Impression Statement Patient demonstrates excellent motivation during session today. Patient continues to demonstrate deficits with respect to postural endurance/strength and low back pain. Progressed exercises today to include quadruped strengthening as well as prone planks. Repeated standing pallof press and lateral step-outs. Pt provided education regarding possible discharge instructions for planning purposes but advised to follow all discharge information provided by his surgeon. Also recommended the book "Your Nerves are Having Back Surgery," by Margarita Sermons. Progressed HEP today to include additional strengthening. Patient will benefit from continued PT to address deficits in mobility, muscle tightness, and pain in order to achieve a better, more functional QOL.    Personal Factors and Comorbidities Comorbidity 2    Comorbidities HTN, Previous stroke    Examination-Activity Limitations Bend;Lift;Sit;Stand    Examination-Participation Restrictions Occupation;Other   Hobbies such as fishing and hunting   Stability/Clinical Decision Making Evolving/Moderate complexity    Rehab Potential Good    PT Frequency 2x / week    PT Duration 8 weeks    PT Treatment/Interventions Cryotherapy;Electrical Stimulation;Iontophoresis 66m/ml Dexamethasone;Moist Heat;Traction;Gait training;Stair training;Functional mobility  training;Therapeutic activities;Therapeutic exercise;Balance training;Neuromuscular re-education;Manual techniques;Passive range of motion;Dry needling;Spinal Manipulations;Joint Manipulations;Canalith Repostioning;Ultrasound;Patient/family education;Vestibular    PT Next Visit Plan continue with progression of prone activities and lumbar central passive accessory mobility as well as strengthening, update HEP as needed    PT Home Exercise Plan Access Code: [email or web address]    Consulted and Agree with Plan of Care Patient           Patient will benefit from skilled therapeutic intervention in order to improve the following deficits and impairments:  Abnormal gait,Hypomobility,Increased muscle spasms,Impaired sensation,Decreased range of motion,Decreased activity tolerance,Decreased strength,Pain  Visit Diagnosis: Chronic bilateral low back pain with bilateral sciatica     Problem List Patient Active Problem List   Diagnosis Date Noted  . 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 Chronic right-sided low back pain with right-sided sciatica 09/02/2019  . Abnormal ECG 10/30/2018  . Heart palpitations 10/30/2018  . SOB (shortness of breath) on exertion 10/30/2018  . DDD (degenerative disc disease), lumbar 06/13/2017  . Obesity (BMI 30.0-34.9) 06/13/2017  . Headache disorder 03/23/2015  . Cerebral infarction ([place] 11/30/2012  . Tests ordered 11/30/2012  . Essential hypertension 07/06/2012  . Hypercholesterolemia 07/06/2012  . Snuff user 07/06/2012     JPhillips GroutPT, DPT, GCS  Lavern Crimi 06/28/2020, 9:19 AM  [place] [facility] [place] [address]Phone: [phone]  Fax:  [phone] Name: KBABATUNDE SEAGOMRN: [id number] of Birth: 103/21/1977

## 2020-07-03 ENCOUNTER — Ambulatory Visit: Payer: BC Managed Care – PPO

## 2020-07-03 ENCOUNTER — Other Ambulatory Visit: Payer: Self-pay

## 2020-07-03 DIAGNOSIS — G8929 Other chronic pain: Secondary | ICD-10-CM

## 2020-07-03 DIAGNOSIS — M5442 Lumbago with sciatica, left side: Secondary | ICD-10-CM | POA: Diagnosis not present

## 2020-07-03 NOTE — Therapy (Signed)
[facility] [address] [place], [place], [address] Phone: [phone]   Fax:  [phone]  Physical Therapy Treatment  Patient Details  Name: Jerome [name] MRN: [id number] Date of Birth: [birth year] Referring Provider (PT): Dr. Girtha Hake   Encounter Date: 07/03/2020   PT End of Session - 07/03/20 0816    Visit Number 14    Number of Visits 17    Date for PT Re-Evaluation 07/12/20    Authorization Type eval: 05/17/20    PT Start Time 0800    PT Stop Time 0845    PT Time Calculation (min) 45 min    Activity Tolerance Patient tolerated treatment well    Behavior During Therapy [facility] for tasks assessed/performed           Past Medical History:  Diagnosis Date  . Hyperlipidemia   . Hypertension   . Pneumothorax, spontaneous, tension 2001    History reviewed. No pertinent surgical history.  There were no vitals filed for this visit.   Subjective Assessment - 07/03/20 0807    Subjective Patient states that he is doing well today. Complains of 2/10 back pain upon arrival today. [place] the [place]. No specific/new concerns mentioned.    Pertinent History LBP started a couple of years ago when he sneezed.  Difficulties bending to the floor, numbness down left and right leg and tingling down thighs.  Ischial tuberosity bothering him after first injection.  Has trouble lying on back, more comfortable on stomach.  Has had about 5 injections and then wears off after a couple of days. Feels like his back is tired and a discomfort constantly.  Hasn't gone back to the gym since injury.  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  Lifting aggravates. 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 Mild to moderate spinal canal/neural foraminal  narrowing at this level.  Mild left L2-3 and bilateral L3-4 neural foraminal narrowing.L5-S1 central and left subarticular protrusions with moderate left  and mild right neural foraminal narrowing.  Patient went through physical therapy last year [place] [facility] and reports that it was helpful.    Limitations Sitting;Lifting;Standing    How long can you sit comfortably? 10 minutes at work, 45 minutes in [place] at home    How long can you stand comfortably? 20 minutes    How long can you walk comfortably? unknown    Diagnostic tests MRI, see history    Patient Stated Goals Getting back to routine at the gym    Currently in Pain? Yes    Pain Score 2     Pain Location Back    Pain Orientation Right;Left;Lower    Pain Descriptors / Indicators Tightness    Pain Type Chronic pain    Pain Onset More than a month ago    Pain Frequency Constant               TREATMENT    Therapeutic Exercise SciFit L4 x 5 minutes for warm-up during subjective; Prone straight and bent kneehip extension 2 x 15 BLE; Child's pose 30s hold Quadruped bird dogs with transverse abdominis contraction x 10 BLE; Quadruped fire hydrants with transverse abdominis contraction x 10 BLE; Prone knee planks 20s, 2 x 30s; TRX reverse lunges x 10 BLE; Nautilus resisted gait 80#  forward, R lateral, L lateral, and backwards x 3 each direction; Pallof press with Nautilus 60# x 10 each direction; 30# box lifts, good technique after demonstration by therapist, 2 x 10; BOSU (round side up) squats with mirror feedback 2 x 10;   Manual Therapy CPA L1-L5, grade II-III, 2x30s bouts each level; STM perform to bilateral lumbar paraspinals with vibratory and percussive device;   Pt educated throughout session about proper posture and technique with exercises. Improved exercise technique, movement at target joints, use of target muscles after min to mod verbal, visual, tactile cues.    Patient demonstrates excellent  motivation during session today.Patient continues to demonstrate deficits with respect to postural endurance/strength and low back pain however he is able to hold his front planks longer today demonstrating improved endurance. Continued with quadruped strengthening today and also progressed weighted box lifts as well as BOSU squats. Patient will benefit from continued PT to address deficits in mobility, muscle tightness, and pain in order to achieve a better, more functional QOL.                            PT Short Term Goals - 06/14/20 0943      PT SHORT TERM GOAL #1   Title Pt will be independent with HEP in order to improve strength and decrease back pain in order to improve pain-free function at home and work    Time 4    Period Weeks    Status On-going    Target Date 07/12/20             PT Long Term Goals - 06/14/20 0943      PT LONG TERM GOAL #1   Title Pt will decrease mODI score to less than 14% in order demonstrate clinically significant reduction in back pain/disability.    Baseline 05/17/20: 40%; 06/14/20: 26%;    Time 8    Period Weeks    Status Revised    Target Date 08/09/20      PT LONG TERM GOAL #2   Title Pt will decrease worst back pain as reported on NPRS to no more than 2/10 in order to demonstrate clinically significant reduction in back pain.    Baseline 05/17/20: 6/10; 06/14/20: 4/10;    Time 8    Period Weeks    Status Revised    Target Date 08/09/20      PT LONG TERM GOAL #3   Title Pt will increase strength of by at least 1/2 MMT grade of hip extension and knee flexion bilaterally  in order to demonstrate improvement in strength and function.    Baseline 05/17/20: L+R hip extension: 4+/5, L+R knee flexion: 4+5; 06/14/20: R/L hip extension: 5/5, knee flexion: 5/5    Time 8    Period Weeks    Status Achieved      PT LONG TERM GOAL #4   Title Patient improved FOTO score to at least 68 in order to demonstrate significant improvement in  his function at home and work    Baseline 05/17/2020: 59; 06/14/20: 63    Time 8    Period Weeks    Status Partially Met    Target Date 08/09/20                 Plan - 07/03/20 0817    Clinical Impression Statement Patient demonstrates excellent motivation during session today. Patient continues to demonstrate deficits with respect to postural endurance/strength  and low back pain however he is able to hold his front planks longer today demonstrating improved endurance. Continued with quadruped strengthening today and also progressed weighted box lifts as well as BOSU squats. Patient will benefit from continued PT to address deficits in mobility, muscle tightness, and pain in order to achieve a better, more functional QOL.    Personal Factors and Comorbidities Comorbidity 2    Comorbidities HTN, Previous stroke    Examination-Activity Limitations Bend;Lift;Sit;Stand    Examination-Participation Restrictions Occupation;Other   Hobbies such as fishing and hunting   Stability/Clinical Decision Making Evolving/Moderate complexity    Rehab Potential Good    PT Frequency 2x / week    PT Duration 8 weeks    PT Treatment/Interventions Cryotherapy;Electrical Stimulation;Iontophoresis 54m/ml Dexamethasone;Moist Heat;Traction;Gait training;Stair training;Functional mobility training;Therapeutic activities;Therapeutic exercise;Balance training;Neuromuscular re-education;Manual techniques;Passive range of motion;Dry needling;Spinal Manipulations;Joint Manipulations;Canalith Repostioning;Ultrasound;Patient/family education;Vestibular    PT Next Visit Plan continue with progression of prone activities and lumbar central passive accessory mobility as well as strengthening, update HEP as needed    PT Home Exercise Plan Access Code: [email or web address]    Consulted and Agree with Plan of Care Patient           Patient will benefit from skilled therapeutic intervention in order to improve the following deficits and  impairments:  Abnormal gait,Hypomobility,Increased muscle spasms,Impaired sensation,Decreased range of motion,Decreased activity tolerance,Decreased strength,Pain  Visit Diagnosis: Chronic bilateral low back pain with bilateral sciatica     Problem List Patient Active Problem List   Diagnosis Date Noted  . 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 Chronic right-sided low back pain with right-sided sciatica 09/02/2019  . Abnormal ECG 10/30/2018  . Heart palpitations 10/30/2018  . SOB (shortness of breath) on exertion 10/30/2018  . DDD (degenerative disc disease), lumbar 06/13/2017  . Obesity (BMI 30.0-34.9) 06/13/2017  . Headache disorder 03/23/2015  . Cerebral infarction ([place] 11/30/2012  . Tests ordered 11/30/2012  . Essential hypertension 07/06/2012  . Hypercholesterolemia 07/06/2012  . Snuff user 07/06/2012   JPhillips GroutPT, DPT, GCS  Huprich,Jason 07/03/2020, 9:43 AM  [place] [facility] [place] [address]Phone: [phone]  Fax:  [phone] Name: KCORNELIUS MARULLOMRN: [id number] of Birth: 117-Apr-1977

## 2020-07-05 ENCOUNTER — Ambulatory Visit: Payer: BC Managed Care – PPO

## 2020-07-05 ENCOUNTER — Other Ambulatory Visit: Payer: Self-pay

## 2020-07-05 DIAGNOSIS — G8929 Other chronic pain: Secondary | ICD-10-CM

## 2020-07-05 DIAGNOSIS — M5442 Lumbago with sciatica, left side: Secondary | ICD-10-CM | POA: Diagnosis not present

## 2020-07-05 DIAGNOSIS — M5441 Lumbago with sciatica, right side: Secondary | ICD-10-CM

## 2020-07-05 NOTE — Therapy (Signed)
[place] [facility] [address] [place], [place], [address] Phone: [phone]   Fax:  [phone]  Physical Therapy Treatment  Patient Details  Name: Jerome [name] MRN: [id number] Date of Birth: 1975/04/19 Referring Provider (PT): Dr. Girtha Hake   Encounter Date: 07/05/2020   PT End of Session - 07/05/20 1405    Visit Number 15    Number of Visits 17    Date for PT Re-Evaluation 07/12/20    Authorization Type eval: 05/17/20    PT Start Time 0800    PT Stop Time 0845    PT Time Calculation (min) 45 min    Activity Tolerance Patient tolerated treatment well    Behavior During Therapy [facility] for tasks assessed/performed           Past Medical History:  Diagnosis Date  . Hyperlipidemia   . Hypertension   . Pneumothorax, spontaneous, tension 2001    History reviewed. No pertinent surgical history.  There were no vitals filed for this visit.   Subjective Assessment - 07/05/20 1404    Subjective Patient states that he is doing well today. Complains of 2/10 back pain upon arrival today. No specific/new questions or concerns mentioned.    Pertinent History LBP started a couple of years ago when he sneezed.  Difficulties bending to the floor, numbness down left and right leg and tingling down thighs.  Ischial tuberosity bothering him after first injection.  Has trouble lying on back, more comfortable on stomach.  Has had about 5 injections and then wears off after a couple of days. Feels like his back is tired and a discomfort constantly.  Hasn't gone back to the gym since injury.  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  Lifting aggravates. 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 Mild to moderate spinal canal/neural foraminal narrowing at this level.   Mild left L2-3 and bilateral L3-4 neural foraminal narrowing.L5-S1 central and left subarticular protrusions with moderate left  and mild right neural foraminal narrowing.  Patient went through physical therapy last year [place] [facility] and reports that it was helpful.    Limitations Sitting;Lifting;Standing    How long can you sit comfortably? 10 minutes at work, 45 minutes in [place] at home    How long can you stand comfortably? 20 minutes    How long can you walk comfortably? unknown    Diagnostic tests MRI, see history    Patient Stated Goals Getting back to routine at the gym    Currently in Pain? Yes    Pain Score 2     Pain Location Back    Pain Orientation Right;Left;Lower    Pain Descriptors / Indicators Tightness    Pain Type Chronic pain    Pain Onset More than a month ago    Pain Frequency Constant    Multiple Pain Sites --                 TREATMENT   TherapeuticExercise SciFit L4 x 5 minutes for warm-up duringsubjective; TRX reverse lunges 2 x 10 BLE; TRX lateral lunges 2 x 10 BLE; Supine SLR with transverse abdominis contraction x 10 BLE; Dead bugs x 10 on each side; Prone full front planks 15s hold/30s relax x 3; Side knee planks 15s hold/30s relax x 3 on each side Dead  lifts 60# x 10, 70# x 10, extensive education with patient, demonstration, and verbal cues for proper form; Lateral blue tband resisted gait x 1 minute each direction;   Pt educated throughout session about proper posture and technique with exercises. Improved exercise technique, movement at target joints, use of target muscles after min to mod verbal, visual, tactile cues.    Patient demonstrates excellent motivation during session today.Session today focused on strengthening and pt was able to progress to full front planks. Also added side knee planks into program today as well as dead lifts. Pt is able to complete all exercises without any increase in pain. He is waiting to  hear back from surgeon regarding scheduling his back surgery and would like to continue therapy until his back surgery to improve his recovery after surgery. Patientwill benefit from continued PT to address deficits in mobility, muscle tightness, and pain in order to achieve a better,more functional QOL.                    PT Short Term Goals - 06/14/20 0943      PT SHORT TERM GOAL #1   Title Pt will be independent with HEP in order to improve strength and decrease back pain in order to improve pain-free function at home and work    Time 4    Period Weeks    Status On-going    Target Date 07/12/20             PT Long Term Goals - 06/14/20 0943      PT LONG TERM GOAL #1   Title Pt will decrease mODI score to less than 14% in order demonstrate clinically significant reduction in back pain/disability.    Baseline 05/17/20: 40%; 06/14/20: 26%;    Time 8    Period Weeks    Status Revised    Target Date 08/09/20      PT LONG TERM GOAL #2   Title Pt will decrease worst back pain as reported on NPRS to no more than 2/10 in order to demonstrate clinically significant reduction in back pain.    Baseline 05/17/20: 6/10; 06/14/20: 4/10;    Time 8    Period Weeks    Status Revised    Target Date 08/09/20      PT LONG TERM GOAL #3   Title Pt will increase strength of by at least 1/2 MMT grade of hip extension and knee flexion bilaterally  in order to demonstrate improvement in strength and function.    Baseline 05/17/20: L+R hip extension: 4+/5, L+R knee flexion: 4+5; 06/14/20: R/L hip extension: 5/5, knee flexion: 5/5    Time 8    Period Weeks    Status Achieved      PT LONG TERM GOAL #4   Title Patient improved FOTO score to at least 68 in order to demonstrate significant improvement in his function at home and work    Baseline 05/17/2020: 59; 06/14/20: 63    Time 8    Period Weeks    Status Partially Met    Target Date 08/09/20                 Plan - 07/05/20 1406     Clinical Impression Statement Patient demonstrates excellent motivation during session today. Session today focused on strengthening and pt was able to progress to full front planks. Also added side knee planks into program today as well as dead lifts. Pt is able to complete   all exercises without any increase in pain. He is waiting to hear back from surgeon regarding scheduling his back surgery and would like to continue therapy until his back surgery to improve his recovery after surgery. Patient will benefit from continued PT to address deficits in mobility, muscle tightness, and pain in order to achieve a better, more functional QOL.    Personal Factors and Comorbidities Comorbidity 2    Comorbidities HTN, Previous stroke    Examination-Activity Limitations Bend;Lift;Sit;Stand    Examination-Participation Restrictions Occupation;Other   Hobbies such as fishing and hunting   Stability/Clinical Decision Making Evolving/Moderate complexity    Rehab Potential Good    PT Frequency 2x / week    PT Duration 8 weeks    PT Treatment/Interventions Cryotherapy;Electrical Stimulation;Iontophoresis 64m/ml Dexamethasone;Moist Heat;Traction;Gait training;Stair training;Functional mobility training;Therapeutic activities;Therapeutic exercise;Balance training;Neuromuscular re-education;Manual techniques;Passive range of motion;Dry needling;Spinal Manipulations;Joint Manipulations;Canalith Repostioning;Ultrasound;Patient/family education;Vestibular    PT Next Visit Plan continue with progression of prone activities and lumbar central passive accessory mobility as well as strengthening, update HEP as needed    PT Home Exercise Plan Access Code: [email or web address]    Consulted and Agree with Plan of Care Patient           Patient will benefit from skilled therapeutic intervention in order to improve the following deficits and impairments:  Abnormal gait,Hypomobility,Increased muscle spasms,Impaired sensation,Decreased  range of motion,Decreased activity tolerance,Decreased strength,Pain  Visit Diagnosis: Chronic bilateral low back pain with bilateral sciatica     Problem List Patient Active Problem List   Diagnosis Date Noted  . 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 Chronic right-sided low back pain with right-sided sciatica 09/02/2019  . Abnormal ECG 10/30/2018  . Heart palpitations 10/30/2018  . SOB (shortness of breath) on exertion 10/30/2018  . DDD (degenerative disc disease), lumbar 06/13/2017  . Obesity (BMI 30.0-34.9) 06/13/2017  . Headache disorder 03/23/2015  . Cerebral infarction ([place] 11/30/2012  . Tests ordered 11/30/2012  . Essential hypertension 07/06/2012  . Hypercholesterolemia 07/06/2012  . Snuff user 07/06/2012   JPhillips GroutPT, DPT, GCS  Jerome [name] 07/05/2020, 2:17 PM  [place] [facility] [place] [address]Phone: [phone]  Fax:  [phone] Name: KDORIS MCGILVERYMRN: [id number] of Birth: 107/05/1975

## 2020-07-10 ENCOUNTER — Ambulatory Visit: Payer: BC Managed Care – PPO

## 2020-07-11 NOTE — Patient Instructions (Incomplete)
TREATMENT   TherapeuticExercise SciFit L4 x 5 minutes for warm-up duringsubjective; TRX reverse lunges 2 x 10 BLE; TRX lateral lunges 2 x 10 BLE; Supine SLR with transverse abdominis contraction x 10 BLE; Dead bugs x 10 on each side; Prone full front planks 15s hold/30s relax x 3; Side knee planks 15s hold/30s relax x 3 on each side Dead lifts 60# x 10, 70# x 10, extensive education with patient, demonstration, and verbal cues for proper form; Lateral blue tband resisted gait x 1 minute each direction;   Pt educated throughout session about proper posture and technique with exercises. Improved exercise technique, movement at target joints, use of target muscles after min to mod verbal, visual, tactile cues.    Patient demonstrates excellent motivation during session today.Session today focused on strengthening and pt was able to progress to full front planks. Also added side knee planks into program today as well as dead lifts. Pt is able to complete all exercises without any increase in pain. He is waiting to hear back from surgeon regarding scheduling his back surgery and would like to continue therapy until his back surgery to improve his recovery after surgery. Patientwill benefit from continued PT to address deficits in mobility, muscle tightness, and pain in order to achieve a better,more functional QOL.

## 2020-07-12 ENCOUNTER — Ambulatory Visit: Payer: BC Managed Care – PPO

## 2020-07-12 ENCOUNTER — Other Ambulatory Visit: Payer: Self-pay

## 2020-07-12 DIAGNOSIS — G8929 Other chronic pain: Secondary | ICD-10-CM

## 2020-07-12 DIAGNOSIS — M5442 Lumbago with sciatica, left side: Secondary | ICD-10-CM | POA: Diagnosis not present

## 2020-07-12 NOTE — Therapy (Signed)
[facility] [address]. [place], [place], [address] Phone: [phone]   Fax:  [phone]  Physical Therapy Treatment/Recertification  Patient Details  Name: Jerome [name] MRN: [id number] Date of Birth: [birth year] Referring Provider (PT): Dr. Girtha Hake   Encounter Date: 07/12/2020   PT End of Session - 07/12/20 2105    Visit Number 16    Number of Visits 33    Date for PT Re-Evaluation 09/06/20    Authorization Type eval: 05/17/20    PT Start Time 1400    PT Stop Time 1445    PT Time Calculation (min) 45 min    Activity Tolerance Patient tolerated treatment well    Behavior During Therapy [facility] for tasks assessed/performed           Past Medical History:  Diagnosis Date  . Hyperlipidemia   . Hypertension   . Pneumothorax, spontaneous, tension 2001    History reviewed. No pertinent surgical history.  There were no vitals filed for this visit.   Subjective Assessment - 07/12/20 1409    Subjective Patient states that he is doing well today. Complains of 1/10 back pain upon arrival today but reports that his back was very aggravated yesterday. No specific/new questions or concerns mentioned.    Pertinent History LBP started a couple of years ago when he sneezed.  Difficulties bending to the floor, numbness down left and right leg and tingling down thighs.  Ischial tuberosity bothering him after first injection.  Has trouble lying on back, more comfortable on stomach.  Has had about 5 injections and then wears off after a couple of days. Feels like his back is tired and a discomfort constantly.  Hasn't gone back to the gym since injury.  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  Lifting aggravates. 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  Mild to moderate spinal canal/neural foraminal narrowing at this level.  Mild left L2-3 and bilateral L3-4 neural foraminal narrowing.L5-S1 central and left subarticular protrusions with moderate left  and mild right neural foraminal narrowing.  Patient went through physical therapy last year [facility] and reports that it was helpful.    Limitations Sitting;Lifting;Standing    How long can you sit comfortably? 10 minutes at work, 45 minutes in [place] at home    How long can you stand comfortably? 20 minutes    How long can you walk comfortably? unknown    Diagnostic tests MRI, see history    Patient Stated Goals Getting back to routine at the gym    Currently in Pain? Yes    Pain Score 1     Pain Location Back    Pain Orientation Right;Left;Lower    Pain Descriptors / Indicators Tightness    Pain Type Chronic pain    Pain Onset More than a month ago    Pain Frequency Constant                 TREATMENT   TherapeuticExercise NuStep L2-6 x 5 minutes for warm-up during history; TRX single leg squats x 10 BLE; TRX lateral lunges 2 x 10 BLE; Pallof press 50# x 10, 60# x 10 each direction; Dead lifts 70# x 15, 80# x 15 repeated education with patient, demonstration, and verbal cues for proper form; Supine bicycles x 15 BLE;  Dead bugs x 15 on each side; Pball bent and straight knee bridges 3s hold x 10 each; Prone full front planks 20s hold/30s relax x 3; Side planks 10s hold/20s relax x 3 on each side   Pt educated throughout session about proper posture and technique with exercises. Improved exercise technique, movement at target joints, use of target muscles after min to mod verbal, visual, tactile cues.    Patient demonstrates excellent motivation during session today. Updated outcome measures and goals with patient last on 06/14/20. At that time patient's Modified Oswestry Disability Index score had decreased from 40% at initial evaluation to 26%.  His worst  reported pain had decreased from 6/10 to 4/10 and his hip extension as well as knee flexion strength both improved.  His FOTO score improved from 59 to 63. At that time patient was reporting about 30% improvement of symptoms since starting with therapy. Pt elected to proceed with back surgery and has scheduled for 08/23/20. Plan is to continue with hip, abdominal, and low back strengthening in anticipation of upcoming back surgery. Pt denies any pain during session today during exercises.Patient is making excellent progress towards his goals. His condition has the potential to improve in response to therapy. Maximum improvement is yet to be obtained. The anticipated improvement is attainable and reasonable in a generally predictable time. Patient encouraged to continue his HEP and follow-up as scheduled. He will benefit from continued PT to address deficits in mobility, muscle tightness, and pain in order to achieve a better more functional QOL.                          PT Short Term Goals - 07/12/20 2106      PT SHORT TERM GOAL #1   Title Pt will be independent with HEP in order to improve strength and decrease back pain in order to improve pain-free function at home and work    Time 4    Period Weeks    Status Achieved             PT Long Term Goals - 07/12/20 2106      PT LONG TERM GOAL #1   Title Pt will decrease mODI score to less than 14% in order demonstrate clinically significant reduction in back pain/disability.    Baseline 05/17/20: 40%; 06/14/20: 26%;    Time 8    Period Weeks    Status Partially Met    Target Date 09/06/20      PT LONG TERM GOAL #2   Title Pt will decrease worst back pain as reported on NPRS to no more than 2/10 in order to demonstrate clinically significant reduction in back pain.    Baseline 05/17/20: 6/10; 06/14/20: 4/10;    Time 8    Period Weeks    Status Partially Met    Target Date 09/06/20      PT LONG TERM GOAL #3   Title Pt  will increase strength of by at least 1/2 MMT grade of hip extension and knee flexion bilaterally  in order to demonstrate improvement in strength and function.    Baseline 05/17/20: L+R hip extension: 4+/5, L+R knee flexion: 4+5; 06/14/20: R/L hip extension: 5/5, knee flexion: 5/5    Time 8    Period Weeks    Status Achieved      PT LONG TERM GOAL #4   Title Patient improved FOTO score to at least 68 in order to demonstrate  significant improvement in his function at home and work    Baseline 05/17/2020: 59; 06/14/20: 63    Time 8    Period Weeks    Status Partially Met    Target Date 09/06/20                 Plan - 07/12/20 2105    Clinical Impression Statement Patient demonstrates excellent motivation during session today.  Updated outcome measures and goals with patient last on 06/14/20. At that time patient's Modified Oswestry Disability Index score had decreased from 40% at initial evaluation to 26%.  His worst reported pain had decreased from 6/10 to 4/10 and his hip extension as well as knee flexion strength both improved.  His FOTO score improved from 59 to 63. At that time patient was reporting about 30% improvement of symptoms since starting with therapy. Pt elected to proceed with back surgery and has scheduled for 08/23/20. Plan is to continue with hip, abdominal, and low back strengthening in anticipation of upcoming back surgery. Pt denies any pain during session today during exercises. Patient is making excellent progress towards his goals. His condition has the potential to improve in response to therapy. Maximum improvement is yet to be obtained. The anticipated improvement is attainable and reasonable in a generally predictable time. Patient encouraged to continue his HEP and follow-up as scheduled.  He will benefit from continued PT to address deficits in mobility, muscle tightness, and pain in order to achieve a better more functional QOL.    Personal Factors and Comorbidities  Comorbidity 2    Comorbidities HTN, Previous stroke    Examination-Activity Limitations Bend;Lift;Sit;Stand    Examination-Participation Restrictions Occupation;Other   Hobbies such as fishing and hunting   Stability/Clinical Decision Making Evolving/Moderate complexity    Rehab Potential Good    PT Frequency 2x / week    PT Duration 8 weeks    PT Treatment/Interventions Cryotherapy;Electrical Stimulation;Iontophoresis 2m/ml Dexamethasone;Moist Heat;Traction;Gait training;Stair training;Functional mobility training;Therapeutic activities;Therapeutic exercise;Balance training;Neuromuscular re-education;Manual techniques;Passive range of motion;Dry needling;Spinal Manipulations;Joint Manipulations;Canalith Repostioning;Ultrasound;Patient/family education;Vestibular    PT Next Visit Plan continue with progression of prone activities and lumbar central passive accessory mobility as well as strengthening, update HEP as needed    PT Home Exercise Plan Access Code: [email or web address]    Consulted and Agree with Plan of Care Patient           Patient will benefit from skilled therapeutic intervention in order to improve the following deficits and impairments:  Abnormal gait,Hypomobility,Increased muscle spasms,Impaired sensation,Decreased range of motion,Decreased activity tolerance,Decreased strength,Pain  Visit Diagnosis: Chronic bilateral low back pain with bilateral sciatica     Problem List Patient Active Problem List   Diagnosis Date Noted  . 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 Chronic right-sided low back pain with right-sided sciatica 09/02/2019  . Abnormal ECG 10/30/2018  . Heart palpitations 10/30/2018  . SOB (shortness of breath) on exertion 10/30/2018  . DDD (degenerative disc disease), lumbar 06/13/2017  . Obesity (BMI 30.0-34.9) 06/13/2017  . Headache disorder 03/23/2015  . Cerebral infarction ([place] 11/30/2012  . Tests ordered 11/30/2012  . Essential  hypertension 07/06/2012  . Hypercholesterolemia 07/06/2012  . Snuff user 07/06/2012   JPhillips GroutPT, DPT, GCS  Jerome [name] 07/12/2020, 9:16 PM  [place] [facility] [place] [address]Phone: [phone]  Fax:  [phone] Name: Jerome MONTENEGROMRN: [id number] of Birth: [birth year]

## 2020-07-14 NOTE — Patient Instructions (Incomplete)
TREATMENT   TherapeuticExercise NuStep L2-6 x 5 minutes for warm-up during history; TRX single leg squatsx 10 BLE; TRXlaterallunges 2x 10 BLE; Pallof press 50# x 10, 60# x 10 each direction; Dead lifts 70# x 15, 80# x 15 repeated education with patient, demonstration, and verbal cues for proper form; Supine bicycles x 15 BLE; Dead bugs x 15 on each side; Pball bent and straight knee bridges 3s hold x 10 each; Prone full front planks 20s hold/30s relax x 3; Side planks 10s hold/20s relax x 3 on each side   Pt educated throughout session about proper posture and technique with exercises. Improved exercise technique, movement at target joints, use of target muscles after min to mod verbal, visual, tactile cues.    Patient demonstrates excellent motivation during session today.Session today focused on strengthening and pt was able to progress to full front planks. Also added side knee planks into program today as well as dead lifts. Pt is able to complete all exercises without any increase in pain. He is waiting to hear back from surgeon regarding scheduling his back surgery and would like to continue therapy until his back surgery to improve his recovery after surgery. Patientwill benefit from continued PT to address deficits in mobility, muscle tightness, and pain in order to achieve a better,more functional QOL.

## 2020-07-17 ENCOUNTER — Ambulatory Visit: Payer: BC Managed Care – PPO

## 2020-07-17 DIAGNOSIS — G8929 Other chronic pain: Secondary | ICD-10-CM

## 2020-07-17 NOTE — Patient Instructions (Incomplete)
TREATMENT   TherapeuticExercise NuStep L2-6 x 5 minutes for warm-up during history; TRX single leg squatsx 10 BLE; TRXlaterallunges 2x 10 BLE; Pallof press 50# x 10, 60# x 10 each direction; Dead lifts 70# x 15, 80# x 15 repeated education with patient, demonstration, and verbal cues for proper form; Supine bicycles x 15 BLE; Dead bugs x 15 on each side; Pball bent and straight knee bridges 3s hold x 10 each; Prone full front planks 20s hold/30s relax x 3; Side planks 10s hold/20s relax x 3 on each side   Pt educated throughout session about proper posture and technique with exercises. Improved exercise technique, movement at target joints, use of target muscles after min to mod verbal, visual, tactile cues.    Patient demonstrates excellent motivation during session today.Updatedoutcome measures and goals with patient last on 06/14/20. At that time patient's Modified OswestryDisabilityIndex score had decreased from 40%atinitial evaluation to 26%. His worst reported pain had decreased from 6/10 to4/10 and his hip extension as well as knee flexion strength both improved. His FOTOscore improved from 59 to63. At that time patient was reporting about 30% improvement of symptoms since starting with therapy. Pt elected to proceed with back surgery and has scheduled for 08/23/20. Plan is to continue with hip, abdominal, and lowbackstrengthening in anticipation of upcoming back surgery. Pt denies any pain during session today during exercises.Patient is making excellent progress towards his goals. His condition has the potential to improve in response to therapy. Maximum improvement is yet to be obtained. The anticipated improvement is attainable and reasonable in a generally predictable time.Patient encouraged to continue his HEP and follow-up as scheduled. He will benefit from continued PT to address deficits in mobility, muscle tightness, and pain in order to achieve a better  more functional QOL.

## 2020-07-19 ENCOUNTER — Ambulatory Visit: Payer: BC Managed Care – PPO | Attending: Physical Medicine & Rehabilitation

## 2020-07-19 ENCOUNTER — Other Ambulatory Visit: Payer: Self-pay

## 2020-07-19 DIAGNOSIS — M5442 Lumbago with sciatica, left side: Secondary | ICD-10-CM | POA: Diagnosis present

## 2020-07-19 DIAGNOSIS — M5441 Lumbago with sciatica, right side: Secondary | ICD-10-CM | POA: Insufficient documentation

## 2020-07-19 DIAGNOSIS — G8929 Other chronic pain: Secondary | ICD-10-CM | POA: Insufficient documentation

## 2020-07-19 NOTE — Therapy (Signed)
[facility] [address] [place], [place], [address] Phone: [phone]   Fax:  [phone]  Physical Therapy Treatment  Patient Details  Name: Jerome [name] MRN: [id number] Date of Birth: 19-Oct-1975 Referring Provider (PT): Dr. Girtha Hake   Encounter Date: 07/19/2020   PT End of Session - 07/19/20 1409    Visit Number 17    Number of Visits 33    Date for PT Re-Evaluation 09/06/20    Authorization Type eval: 05/17/20    PT Start Time 7989    PT Stop Time 1445    PT Time Calculation (min) 40 min    Activity Tolerance Patient tolerated treatment well    Behavior During Therapy [facility] for tasks assessed/performed           Past Medical History:  Diagnosis Date  . Hyperlipidemia   . Hypertension   . Pneumothorax, spontaneous, tension 2001    History reviewed. No pertinent surgical history.  There were no vitals filed for this visit.   Subjective Assessment - 07/19/20 1407    Subjective Patient states that he is doing well today. Complains of 1/10 back pain upon arrival today. Back surgery is scheduled for 08/23/20. No specific/new questions or concerns mentioned.    Pertinent History LBP started a couple of years ago when he sneezed.  Difficulties bending to the floor, numbness down left and right leg and tingling down thighs.  Ischial tuberosity bothering him after first injection.  Has trouble lying on back, more comfortable on stomach.  Has had about 5 injections and then wears off after a couple of days. Feels like his back is tired and a discomfort constantly.  Hasn't gone back to the gym since injury.  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  Lifting aggravates. 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 Mild to moderate spinal canal/neural  foraminal narrowing at this level.  Mild left L2-3 and bilateral L3-4 neural foraminal narrowing.L5-S1 central and left subarticular protrusions with moderate left  and mild right neural foraminal narrowing.  Patient went through physical therapy last year [facility] and reports that it was helpful.    Limitations Sitting;Lifting;Standing    How long can you sit comfortably? 10 minutes at work, 45 minutes in [place] at home    How long can you stand comfortably? 20 minutes    How long can you walk comfortably? unknown    Diagnostic tests MRI, see history    Patient Stated Goals Getting back to routine at the gym    Currently in Pain? Yes    Pain Score 1     Pain Location Back    Pain Orientation Right;Left    Pain Descriptors / Indicators Tightness    Pain Type Chronic pain    Pain Onset More than a month ago    Pain Frequency Constant                TREATMENT   TherapeuticExercise Scifit L4 x 5 minutes for warm-up during history; Nautilus lat pull down 95# 2 x 15; Nautilus shoulder extension 60# 2 x 15; Nautilus rows 50# 2 x 15; Nautilus chest press 60# 2 x 15; Nautilus pallof press 50# x 10 each direction; Dead lifts 80# x 15 repeated education with patient, demonstration, and verbal cues for proper form;  Walking lunges with overhead  TRX single leg squatsx 10 BLE; TRXlaterallunges 2x 10 BLE; Supine bicycles x 15 BLE; Dead bugs x 15 on each side; Prone full front planks 20s hold/30s relax x 2; Side planks 15s hold/30s relax x 2 on each side   Pt educated throughout session about proper posture and technique with exercises. Improved exercise technique, movement at target joints, use of target muscles after min to mod verbal, visual, tactile cues.    Patient demonstrates excellent motivation during session todayPatient is making excellent progress towards his goals. Continued with abdominal and low back strengthening during session today. He is able  to increase resistance today as well as progress toward more challenging exercises such as walking lunges.Patient encouraged to continue his HEP and follow-up as scheduled. He will benefit from continued PT to address deficits in mobility, muscle tightness, and pain in order to achieve a better more functional QOL.                           PT Short Term Goals - 07/12/20 2106      PT SHORT TERM GOAL #1   Title Pt will be independent with HEP in order to improve strength and decrease back pain in order to improve pain-free function at home and work    Time 4    Period Weeks    Status Achieved             PT Long Term Goals - 07/12/20 2106      PT LONG TERM GOAL #1   Title Pt will decrease mODI score to less than 14% in order demonstrate clinically significant reduction in back pain/disability.    Baseline 05/17/20: 40%; 06/14/20: 26%;    Time 8    Period Weeks    Status Partially Met    Target Date 09/06/20      PT LONG TERM GOAL #2   Title Pt will decrease worst back pain as reported on NPRS to no more than 2/10 in order to demonstrate clinically significant reduction in back pain.    Baseline 05/17/20: 6/10; 06/14/20: 4/10;    Time 8    Period Weeks    Status Partially Met    Target Date 09/06/20      PT LONG TERM GOAL #3   Title Pt will increase strength of by at least 1/2 MMT grade of hip extension and knee flexion bilaterally  in order to demonstrate improvement in strength and function.    Baseline 05/17/20: L+R hip extension: 4+/5, L+R knee flexion: 4+5; 06/14/20: R/L hip extension: 5/5, knee flexion: 5/5    Time 8    Period Weeks    Status Achieved      PT LONG TERM GOAL #4   Title Patient improved FOTO score to at least 68 in order to demonstrate significant improvement in his function at home and work    Baseline 05/17/2020: 59; 06/14/20: 63    Time 8    Period Weeks    Status Partially Met    Target Date 09/06/20                 Plan  - 07/19/20 1409    Clinical Impression Statement Patient demonstrates excellent motivation during session today Patient is making excellent progress towards his goals. Continued with abdominal and low back strengthening during session today. He is able to increase resistance today as well as progress toward more challenging exercises  such as walking lunges. Patient encouraged to continue his HEP and follow-up as scheduled.  He will benefit from continued PT to address deficits in mobility, muscle tightness, and pain in order to achieve a better more functional QOL.    Personal Factors and Comorbidities Comorbidity 2    Comorbidities HTN, Previous stroke    Examination-Activity Limitations Bend;Lift;Sit;Stand    Examination-Participation Restrictions Occupation;Other   Hobbies such as fishing and hunting   Stability/Clinical Decision Making Evolving/Moderate complexity    Rehab Potential Good    PT Frequency 2x / week    PT Duration 8 weeks    PT Treatment/Interventions Cryotherapy;Electrical Stimulation;Iontophoresis 34m/ml Dexamethasone;Moist Heat;Traction;Gait training;Stair training;Functional mobility training;Therapeutic activities;Therapeutic exercise;Balance training;Neuromuscular re-education;Manual techniques;Passive range of motion;Dry needling;Spinal Manipulations;Joint Manipulations;Canalith Repostioning;Ultrasound;Patient/family education;Vestibular    PT Next Visit Plan continue with progression of prone activities and lumbar central passive accessory mobility as well as strengthening, update HEP as needed    PT Home Exercise Plan Access Code: [email or web address]    Consulted and Agree with Plan of Care Patient           Patient will benefit from skilled therapeutic intervention in order to improve the following deficits and impairments:  Abnormal gait,Hypomobility,Increased muscle spasms,Impaired sensation,Decreased range of motion,Decreased activity tolerance,Decreased strength,Pain  Visit  Diagnosis: Chronic bilateral low back pain with bilateral sciatica     Problem List Patient Active Problem List   Diagnosis Date Noted  . 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 Chronic right-sided low back pain with right-sided sciatica 09/02/2019  . Abnormal ECG 10/30/2018  . Heart palpitations 10/30/2018  . SOB (shortness of breath) on exertion 10/30/2018  . DDD (degenerative disc disease), lumbar 06/13/2017  . Obesity (BMI 30.0-34.9) 06/13/2017  . Headache disorder 03/23/2015  . Cerebral infarction ([place] 11/30/2012  . Tests ordered 11/30/2012  . Essential hypertension 07/06/2012  . Hypercholesterolemia 07/06/2012  . Snuff user 07/06/2012   JPhillips GroutPT, DPT, GCS  Jontue Crumpacker 07/20/2020, 10:04 AM  [place] [facility] [place] [address]Phone: [phone]  Fax:  [phone] Name: KTAYVIAN HOLYCROSSMRN: [id number] of Birth: 1Apr 14, 1977

## 2020-07-25 NOTE — Patient Instructions (Incomplete)
TREATMENT   TherapeuticExercise Scifit L4 x 5 minutes for warm-upduring history; Nautilus lat pull down 95# 2 x 15; Nautilus shoulder extension 60# 2 x 15; Nautilus rows 50# 2 x 15; Nautilus chest press 60# 2 x 15; Nautilus pallof press 50# x 10 each direction; Dead lifts 80# x 15 repeatededucation with patient, demonstration, and verbal cues for proper form; Walking lunges with overhead  TRXsingle leg squatsx 10 BLE; TRXlaterallunges 2x 10 BLE; Supine bicycles x 15 BLE; Dead bugs x 15on each side; Prone full front planks20s hold/30s relax x 2; Sideplanks15s hold/30s relax x 2 on each side   Pt educated throughout session about proper posture and technique with exercises. Improved exercise technique, movement at target joints, use of target muscles after min to mod verbal, visual, tactile cues.    Patient demonstrates excellent motivation during session todayPatient is making excellent progress towards his goals. Continued with abdominal and low back strengthening during session today. He is able to increase resistance today as well as progress toward more challenging exercises such as walking lunges.Patient encouraged to continue his HEP and follow-up as scheduled. He will benefit from continued PT to address deficits in mobility, muscle tightness, and pain in order to achieve a better more functional QOL.

## 2020-07-26 ENCOUNTER — Other Ambulatory Visit: Payer: Self-pay

## 2020-07-26 ENCOUNTER — Ambulatory Visit: Payer: BC Managed Care – PPO

## 2020-07-26 DIAGNOSIS — G8929 Other chronic pain: Secondary | ICD-10-CM

## 2020-07-26 DIAGNOSIS — M5442 Lumbago with sciatica, left side: Secondary | ICD-10-CM | POA: Diagnosis not present

## 2020-07-26 DIAGNOSIS — M5441 Lumbago with sciatica, right side: Secondary | ICD-10-CM

## 2020-07-26 NOTE — Therapy (Signed)
[facility] [address] [place], [place], [address] Phone: [phone]   Fax:  [phone]  Physical Therapy Treatment  Patient Details  Name: Jerome [name] MRN: [id number] Date of Birth: 1975-06-15 Referring Provider (PT): Dr. Girtha Hake   Encounter Date: 07/26/2020   PT End of Session - 07/26/20 1407    Visit Number 18    Number of Visits 33    Date for PT Re-Evaluation 09/06/20    Authorization Type eval: 05/17/20    PT Start Time 1400    PT Stop Time 1445    PT Time Calculation (min) 45 min    Activity Tolerance Patient tolerated treatment well    Behavior During Therapy [facility] for tasks assessed/performed           Past Medical History:  Diagnosis Date  . Hyperlipidemia   . Hypertension   . Pneumothorax, spontaneous, tension 2001    History reviewed. No pertinent surgical history.  There were no vitals filed for this visit.   Subjective Assessment - 07/26/20 1404    Subjective Patient states that he is doing well today. Denies any back pain upon arrival today. Back surgery is scheduled for 08/23/20. No specific/new questions or concerns mentioned.    Pertinent History LBP started a couple of years ago when he sneezed.  Difficulties bending to the floor, numbness down left and right leg and tingling down thighs.  Ischial tuberosity bothering him after first injection.  Has trouble lying on back, more comfortable on stomach.  Has had about 5 injections and then wears off after a couple of days. Feels like his back is tired and a discomfort constantly.  Hasn't gone back to the gym since injury.  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  Lifting aggravates. 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 Mild to moderate spinal canal/neural  foraminal narrowing at this level.  Mild left L2-3 and bilateral L3-4 neural foraminal narrowing.L5-S1 central and left subarticular protrusions with moderate left  and mild right neural foraminal narrowing.  Patient went through physical therapy last year [facility] and reports that it was helpful.    Limitations Sitting;Lifting;Standing    How long can you sit comfortably? 10 minutes at work, 45 minutes in [place] at home    How long can you stand comfortably? 20 minutes    How long can you walk comfortably? unknown    Diagnostic tests MRI, see history    Patient Stated Goals Getting back to routine at the gym    Currently in Pain? No/denies    Pain Onset --              TREATMENT   TherapeuticExercise Scifit L4 x 5 minutes for warm-upduring history; TRXsingle leg squatsx 10 BLE; Nautilus lat pull down 95# x 15, 110# x 15; Nautilus shoulder extension 60# 2 x 15; Nautilus rows 60# x 15, 70# x 15; Nautilus pallof press 50# x 10 each direction; Dead lifts 95# 2 x 15;  Resisted side stepping with blue tband 2 x 1 minute; Dead bugs 2 x 15on each side; Prone full front planks30s hold/30s relax x 2; Sideplanks20s hold/30s relax x 2 on each side   Pt educated throughout session about proper posture and technique with exercises. Improved exercise technique, movement at target joints, use of  target muscles after min to mod verbal, visual, tactile cues.    Patient demonstrates excellent motivation during session today.Patient is making excellent progress towards his goals. Continued with abdominal and low back strengthening during session today. He is able to increase resistance today again with deadlifts. Increased resistance with side stepping as well and also increased duration for planks.Patient encouraged to continue his HEP and follow-up as scheduled. He will benefit from continued PT to address deficits in mobility, muscle tightness, and pain in order to  achieve a better more functional QOL.                            PT Short Term Goals - 07/12/20 2106      PT SHORT TERM GOAL #1   Title Pt will be independent with HEP in order to improve strength and decrease back pain in order to improve pain-free function at home and work    Time 4    Period Weeks    Status Achieved             PT Long Term Goals - 07/12/20 2106      PT LONG TERM GOAL #1   Title Pt will decrease mODI score to less than 14% in order demonstrate clinically significant reduction in back pain/disability.    Baseline 05/17/20: 40%; 06/14/20: 26%;    Time 8    Period Weeks    Status Partially Met    Target Date 09/06/20      PT LONG TERM GOAL #2   Title Pt will decrease worst back pain as reported on NPRS to no more than 2/10 in order to demonstrate clinically significant reduction in back pain.    Baseline 05/17/20: 6/10; 06/14/20: 4/10;    Time 8    Period Weeks    Status Partially Met    Target Date 09/06/20      PT LONG TERM GOAL #3   Title Pt will increase strength of by at least 1/2 MMT grade of hip extension and knee flexion bilaterally  in order to demonstrate improvement in strength and function.    Baseline 05/17/20: L+R hip extension: 4+/5, L+R knee flexion: 4+5; 06/14/20: R/L hip extension: 5/5, knee flexion: 5/5    Time 8    Period Weeks    Status Achieved      PT LONG TERM GOAL #4   Title Patient improved FOTO score to at least 68 in order to demonstrate significant improvement in his function at home and work    Baseline 05/17/2020: 59; 06/14/20: 63    Time 8    Period Weeks    Status Partially Met    Target Date 09/06/20                 Plan - 07/26/20 1407    Clinical Impression Statement Patient demonstrates excellent motivation during session today. Patient is making excellent progress towards his goals. Continued with abdominal and low back strengthening during session today. He is able to increase resistance  today again with deadlifts. Increased resistance with side stepping as well and also increased duration for planks. Patient encouraged to continue his HEP and follow-up as scheduled.  He will benefit from continued PT to address deficits in mobility, muscle tightness, and pain in order to achieve a better more functional QOL.    Personal Factors and Comorbidities Comorbidity 2    Comorbidities HTN, Previous stroke    Examination-Activity  Limitations Bend;Lift;Sit;Stand    Examination-Participation Restrictions Occupation;Other   Hobbies such as fishing and hunting   Stability/Clinical Decision Making Evolving/Moderate complexity    Rehab Potential Good    PT Frequency 2x / week    PT Duration 8 weeks    PT Treatment/Interventions Cryotherapy;Electrical Stimulation;Iontophoresis 41m/ml Dexamethasone;Moist Heat;Traction;Gait training;Stair training;Functional mobility training;Therapeutic activities;Therapeutic exercise;Balance training;Neuromuscular re-education;Manual techniques;Passive range of motion;Dry needling;Spinal Manipulations;Joint Manipulations;Canalith Repostioning;Ultrasound;Patient/family education;Vestibular    PT Next Visit Plan continue with progression of prone activities and lumbar central passive accessory mobility as well as strengthening, update HEP as needed    PT Home Exercise Plan Access Code: [email or web address]    Consulted and Agree with Plan of Care Patient           Patient will benefit from skilled therapeutic intervention in order to improve the following deficits and impairments:  Abnormal gait,Hypomobility,Increased muscle spasms,Impaired sensation,Decreased range of motion,Decreased activity tolerance,Decreased strength,Pain  Visit Diagnosis: Chronic bilateral low back pain with bilateral sciatica     Problem List Patient Active Problem List   Diagnosis Date Noted  . 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 Chronic right-sided low back pain  with right-sided sciatica 09/02/2019  . Abnormal ECG 10/30/2018  . Heart palpitations 10/30/2018  . SOB (shortness of breath) on exertion 10/30/2018  . DDD (degenerative disc disease), lumbar 06/13/2017  . Obesity (BMI 30.0-34.9) 06/13/2017  . Headache disorder 03/23/2015  . Cerebral infarction ([place] 11/30/2012  . Tests ordered 11/30/2012  . Essential hypertension 07/06/2012  . Hypercholesterolemia 07/06/2012  . Snuff user 07/06/2012   JPhillips GroutPT, DPT, GCS  Wing Gfeller 07/26/2020, 4:19 PM  [place] [facility] [place] [address]Phone: [phone]  Fax:  [phone] Name: Jerome BETHEAMRN: [id number] of Birth: [birth year]

## 2020-07-31 ENCOUNTER — Other Ambulatory Visit: Payer: Self-pay | Admitting: Neurosurgery

## 2020-08-02 ENCOUNTER — Other Ambulatory Visit: Payer: Self-pay

## 2020-08-02 ENCOUNTER — Ambulatory Visit: Payer: BC Managed Care – PPO

## 2020-08-02 DIAGNOSIS — M5441 Lumbago with sciatica, right side: Secondary | ICD-10-CM

## 2020-08-02 DIAGNOSIS — G8929 Other chronic pain: Secondary | ICD-10-CM

## 2020-08-02 DIAGNOSIS — M5442 Lumbago with sciatica, left side: Secondary | ICD-10-CM | POA: Diagnosis not present

## 2020-08-02 NOTE — Therapy (Signed)
[facility] [address] [place], [place], [address] Phone: [phone]   Fax:  [phone]  Physical Therapy Treatment  Patient Details  Name: Jerome [name] MRN: [id number] Date of Birth: 1975-10-01 Referring Provider (PT): Dr. Girtha Hake   Encounter Date: 08/02/2020   PT End of Session - 08/03/20 1133    Visit Number 19    Number of Visits 33    Date for PT Re-Evaluation 09/06/20    Authorization Type eval: 05/17/20    PT Start Time 1400    PT Stop Time 1445    PT Time Calculation (min) 45 min    Activity Tolerance Patient tolerated treatment well    Behavior During Therapy [facility] for tasks assessed/performed           Past Medical History:  Diagnosis Date  . Hyperlipidemia   . Hypertension   . Pneumothorax, spontaneous, tension 2001    History reviewed. No pertinent surgical history.  There were no vitals filed for this visit.   Subjective Assessment - 08/02/20 1405    Subjective Patient states that he is doing well today. Reports his back is a little stiff from sitting on a bleacher all Saturday for a basketball tournament. Pain remains around 1/10. Back surgery is scheduled for 08/23/20. No specific/new questions or concerns mentioned.    Pertinent History LBP started a couple of years ago when he sneezed.  Difficulties bending to the floor, numbness down left and right leg and tingling down thighs.  Ischial tuberosity bothering him after first injection.  Has trouble lying on back, more comfortable on stomach.  Has had about 5 injections and then wears off after a couple of days. Feels like his back is tired and a discomfort constantly.  Hasn't gone back to the gym since injury.  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  Lifting aggravates. 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  Inferiorly migrated right L4-5 paracentral  extrusion abutting the descending L5 nerve root. Mild to moderate spinal canal/neural foraminal narrowing at this level.  Mild left L2-3 and bilateral L3-4 neural foraminal narrowing.L5-S1 central and left subarticular protrusions with moderate left  and mild right neural foraminal narrowing.  Patient went through physical therapy last year [facility] and reports that it was helpful.    Limitations Sitting;Lifting;Standing    How long can you sit comfortably? 10 minutes at work, 45 minutes in [place] at home    How long can you stand comfortably? 20 minutes    How long can you walk comfortably? unknown    Diagnostic tests MRI, see history    Patient Stated Goals Getting back to routine at the gym    Currently in Pain? Yes    Pain Score 1     Pain Location Back    Pain Orientation Right;Left    Pain Descriptors / Indicators Tightness    Pain Type Chronic pain    Pain Onset More than a month ago    Pain Frequency Constant               TREATMENT   TherapeuticExercise Scifit L4 x 5 minutes for warm-upduring history; TRXsingle leg squats2 x 15 BLE; Pball seated marches x 10 BLE; Pball seated overhead shoulder press, feet together, 6# dumbbells, 2 x 10 BUE Nautilus lat pull down 110# x 15; Nautilus dead lifts 110# 2 x 15;  Lunges with 7# overhead dumbbell carry 30' x 2 Resisted side stepping with blue tband x 1 minute; Supine bicycles x 30s; Dead bugs 2 x 15on each side; Hooklying bridges with alternating marches x 10 BLE; Prone full front planks30s hold/30s relax x 2; Sideplanks20s hold/30s relax x 2 on each side   Pt educated throughout session about proper posture and technique with exercises. Improved exercise technique, movement at target joints, use of target muscles after min to mod verbal, visual, tactile cues.    Patient demonstrates excellent motivation during session today.Patient is making excellent progress towards his goals of improved core  stability/strength in anticipation of his upcoming back surgery. Continued with abdominal and low back strengthening during session today. He is able to increase sets today with deadlifts at 110#. Continues to demonstrate decreased abdominal and low back endurance with front and side planks.Patient encouraged to continue his HEP and follow-up as scheduled. He will benefit from continued PT to address deficits in mobility, muscle tightness, and pain in order to achieve a better more functional QOL.                                    PT Short Term Goals - 07/12/20 2106      PT SHORT TERM GOAL #1   Title Pt will be independent with HEP in order to improve strength and decrease back pain in order to improve pain-free function at home and work    Time 4    Period Weeks    Status Achieved             PT Long Term Goals - 07/12/20 2106      PT LONG TERM GOAL #1   Title Pt will decrease mODI score to less than 14% in order demonstrate clinically significant reduction in back pain/disability.    Baseline 05/17/20: 40%; 06/14/20: 26%;    Time 8    Period Weeks    Status Partially Met    Target Date 09/06/20      PT LONG TERM GOAL #2   Title Pt will decrease worst back pain as reported on NPRS to no more than 2/10 in order to demonstrate clinically significant reduction in back pain.    Baseline 05/17/20: 6/10; 06/14/20: 4/10;    Time 8    Period Weeks    Status Partially Met    Target Date 09/06/20      PT LONG TERM GOAL #3   Title Pt will increase strength of by at least 1/2 MMT grade of hip extension and knee flexion bilaterally  in order to demonstrate improvement in strength and function.    Baseline 05/17/20: L+R hip extension: 4+/5, L+R knee flexion: 4+5; 06/14/20: R/L hip extension: 5/5, knee flexion: 5/5    Time 8    Period Weeks    Status Achieved      PT LONG TERM GOAL #4   Title Patient improved FOTO score to at least 68 in order to demonstrate  significant improvement in his function at home and work    Baseline 05/17/2020: 59; 06/14/20: 63    Time 8    Period Weeks    Status Partially Met    Target Date 09/06/20                 Plan - 08/03/20 1134    Clinical Impression Statement Patient demonstrates excellent motivation during session today.  Patient is making excellent progress towards his goals of improved core stability/strength in anticipation of his upcoming back surgery. Continued with abdominal and low back strengthening during session today. He is able to increase sets today with deadlifts at 110#. Continues to demonstrate decreased abdominal and low back endurance with front and side planks. Patient encouraged to continue his HEP and follow-up as scheduled.  He will benefit from continued PT to address deficits in mobility, muscle tightness, and pain in order to achieve a better more functional QOL.    Personal Factors and Comorbidities Comorbidity 2    Comorbidities HTN, Previous stroke    Examination-Activity Limitations Bend;Lift;Sit;Stand    Examination-Participation Restrictions Occupation;Other   Hobbies such as fishing and hunting   Stability/Clinical Decision Making Evolving/Moderate complexity    Rehab Potential Good    PT Frequency 2x / week    PT Duration 8 weeks    PT Treatment/Interventions Cryotherapy;Electrical Stimulation;Iontophoresis 10m/ml Dexamethasone;Moist Heat;Traction;Gait training;Stair training;Functional mobility training;Therapeutic activities;Therapeutic exercise;Balance training;Neuromuscular re-education;Manual techniques;Passive range of motion;Dry needling;Spinal Manipulations;Joint Manipulations;Canalith Repostioning;Ultrasound;Patient/family education;Vestibular    PT Next Visit Plan Update outcome measures and goals, continue with strengthening, update HEP as needed    PT Home Exercise Plan Access Code: [email or web address]   Consulted and Agree with Plan of Care Patient           Patient  will benefit from skilled therapeutic intervention in order to improve the following deficits and impairments:  Abnormal gait,Hypomobility,Increased muscle spasms,Impaired sensation,Decreased range of motion,Decreased activity tolerance,Decreased strength,Pain  Visit Diagnosis: Chronic bilateral low back pain with bilateral sciatica     Problem List Patient Active Problem List   Diagnosis Date Noted  . 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 Chronic right-sided low back pain with right-sided sciatica 09/02/2019  . Abnormal ECG 10/30/2018  . Heart palpitations 10/30/2018  . SOB (shortness of breath) on exertion 10/30/2018  . DDD (degenerative disc disease), lumbar 06/13/2017  . Obesity (BMI 30.0-34.9) 06/13/2017  . Headache disorder 03/23/2015  . Cerebral infarction ([place] 11/30/2012  . Tests ordered 11/30/2012  . Essential hypertension 07/06/2012  . Hypercholesterolemia 07/06/2012  . Snuff user 07/06/2012   JPhillips GroutPT, DPT, GCS  Alisson Rozell 08/03/2020, 11:38 AM  [place] [facility] [place] [address]Phone: [phone]  Fax:  [phone] Name: Jerome KORNEGAYMRN: [id number] of Birth: 113-Feb-1977

## 2020-08-07 NOTE — Patient Instructions (Incomplete)
Physical Therapy Progress Note   Dates of reporting period  06/14/20   to   08/09/20   TREATMENT   TherapeuticExercise Scifit L4x 5 minutes for warm-upduring history;  Updated outcome measures with patient: FOTO: (63) MODI: (26%)  Worst pain: (4/10); 08/09/20:  R/L hip extension: 5/5, knee flexion: 5/5 (06/14/20: R/L hip extension: 5/5, knee flexion: 5/5)  TRXsingle leg squats2 x 15 BLE; Pball seated marches x 10 BLE; Pball seated overhead shoulder press, feet together, 6# dumbbells, 2 x 10 BUE Nautilus lat pull down 110# x 15; Nautilus dead lifts 110# 2 x 15;  Lunges with 7# overhead dumbbell carry 30' x 2 Resisted side stepping with blue tband x 1 minute; Supine bicycles x 30s; Dead bugs 2 x 15on each side; Hooklying bridges with alternating marches x 10 BLE; Prone full front planks30s hold/30s relax x2; Sideplanks20s hold/30s relax x2on each side   Pt educated throughout session about proper posture and technique with exercises. Improved exercise technique, movement at target joints, use of target muscles after min to mod verbal, visual, tactile cues.    Patient demonstrates excellent motivation during session today. Updated outcome measures and goals with patient today.  Patient's Modified Oswestry Disability Index score decreased from 40% at initial evaluation to 26% today.  His worst reported pain has decreased from 6/10 to 4/10 and his hip extension as well as knee flexion strength have both improved.  His FOTO score improved from 59 to 63.  Overall patient reports about 30% improvement of symptoms starting with therapy. Continued with manual techniques to lumbar spine as well as hip and low back strengthening. Pt denies any pain during session today except some intermittent tightness across his low back.Patient is making excellent progress towards his goalsand therapist will continue to progress additional lumbar and abdominal strengthening in follow-up  sessions.Patient's condition has the potential to improve in response to therapy. Maximum improvement is yet to be obtained. The anticipated improvement is attainable and reasonable in a generally predictable time. Patient encouraged to continue his HEP and follow-up as scheduled. He will benefit from continued PT to address deficits in mobility, muscle tightness, and pain in order to achieve a better more functional QOL.    Patient demonstrates excellent motivation during session today.Patient is making excellent progress towards his goals of improved core stability/strength in anticipation of his upcoming back surgery.Continued with abdominal and low back strengthening during session today. He is able to increase sets today with deadlifts at 110#. Continues to demonstrate decreased abdominal and low back endurance with front and side planks.Patient encouraged to continue his HEP and follow-up as scheduled. He will benefit from continued PT to address deficits in mobility, muscle tightness, and pain in order to achieve a better more functional QOL.

## 2020-08-09 ENCOUNTER — Other Ambulatory Visit: Payer: Self-pay

## 2020-08-09 ENCOUNTER — Ambulatory Visit: Payer: BC Managed Care – PPO

## 2020-08-09 DIAGNOSIS — G8929 Other chronic pain: Secondary | ICD-10-CM

## 2020-08-09 DIAGNOSIS — M5442 Lumbago with sciatica, left side: Secondary | ICD-10-CM | POA: Diagnosis not present

## 2020-08-09 NOTE — Therapy (Signed)
[facility] [address] [place], [place], [address] Phone: [phone]   Fax:  [phone]  Physical Therapy Progress Note   Dates of reporting period  06/14/20   to   08/09/20  Patient Details  Name: Jerome [name] MRN: [id number] Date of Birth: 03/17/1976 Referring Provider (PT): Dr. Girtha Hake   Encounter Date: [name]   PT End of Session - 08/09/20 1409    Visit Number 20    Number of Visits 33    Date for PT Re-Evaluation 09/06/20    Authorization Type eval: 05/17/20    PT Start Time 4503    PT Stop Time 1445    PT Time Calculation (min) 42 min    Activity Tolerance Patient tolerated treatment well    Behavior During Therapy [facility] for tasks assessed/performed           Past Medical History:  Diagnosis Date  . Hyperlipidemia   . Hypertension   . Pneumothorax, spontaneous, tension 2001    History reviewed. No pertinent surgical history.  There were no vitals filed for this visit.   Subjective Assessment - 08/09/20 1405    Subjective Patient states that he is doing well today. Reports 2/10 low back pain upon arrival. Back surgery is scheduled for 08/23/20. No specific/new questions or concerns mentioned.    Pertinent History LBP started a couple of years ago when he sneezed.  Difficulties bending to the floor, numbness down left and right leg and tingling down thighs.  Ischial tuberosity bothering him after first injection.  Has trouble lying on back, more comfortable on stomach.  Has had about 5 injections and then wears off after a couple of days. Feels like his back is tired and a discomfort constantly.  Hasn't gone back to the gym since injury.  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  Lifting aggravates. 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  Inferiorly migrated right L4-5 paracentral extrusion abutting the descending  L5 nerve root. Mild to moderate spinal canal/neural foraminal narrowing at this level.  Mild left L2-3 and bilateral L3-4 neural foraminal narrowing.L5-S1 central and left subarticular protrusions with moderate left  and mild right neural foraminal narrowing.  Patient went through physical therapy last year [facility] and reports that it was helpful.    Limitations Sitting;Lifting;Standing    How long can you sit comfortably? 10 minutes at work, 45 minutes in [place] at home    How long can you stand comfortably? 20 minutes    How long can you walk comfortably? unknown    Diagnostic tests MRI, see history    Patient Stated Goals Getting back to routine at the gym    Currently in Pain? Yes    Pain Score 2     Pain Location Back    Pain Orientation Right;Left    Pain Descriptors / Indicators Tightness    Pain Type Chronic pain    Pain Onset More than a month ago    Pain Frequency Constant                TREATMENT   TherapeuticExercise Scifit L4x 5 minutes for warm-upduring history;  Updated outcome measures with patient: FOTO: 59  MODI: 30%  Worst pain: 3/10  Supine SLR with 3# ankle weights (AW) x 15 BLE; Supine bridges x 10; Supine bridges with alternating marches with 3# AW x 15  BLE; Sidelying straight leg hip abduction with 3# AW x 15 BLE;  Sidelying clams with manual resistance x 15 BLE; Sidelying reverse clams with 3# AW x 15 BLE; Prone HS curls with 3# AW x 15 BLE; Prone hip extension with 3# AW x 15 BLE; Prone front planks40s hold/30s relax x 3; Sideplanks20s hold/30s relax x 3 on each side   Pt educated throughout session about proper posture and technique with exercises. Improved exercise technique, movement at target joints, use of target muscles after min to mod verbal, visual, tactile cues.    Patient demonstrates excellent motivation during session today. Updated outcome measures and goals with patient today.  Patient's Modified Oswestry  Disability Index score has increased slightly to 30% today which is still improved from his score of 40% at initial evaluation.  His worst reported pain has decreased from 6/10 to 3/10. His FOTO score worsened to 57. Overall patient reports improvement in his back pain however it remains limiting for him and he has scheduled surgery for 08/23/20. He has continued with therapy once/week prior to surgery to strengthen prior to the procedure. Pt denies any pain during session today. Patient encouraged to continue his HEP and follow-up as scheduled. He has one PT visit let prior to his back surgery and will be discharged at his next visit.                             PT Short Term Goals - 08/09/20 1745      PT SHORT TERM GOAL #1   Title Pt will be independent with HEP in order to improve strength and decrease back pain in order to improve pain-free function at home and work    Time 4    Period Weeks    Status Achieved             PT Long Term Goals - 08/09/20 1745      PT LONG TERM GOAL #1   Title Pt will decrease mODI score to less than 14% in order demonstrate clinically significant reduction in back pain/disability.    Baseline 05/17/20: 40%; 06/14/20: 26%; 08/09/20: 30%    Time 8    Period Weeks    Status Partially Met    Target Date 09/06/20      PT LONG TERM GOAL #2   Title Pt will decrease worst back pain as reported on NPRS to no more than 2/10 in order to demonstrate clinically significant reduction in back pain.    Baseline 05/17/20: 6/10; 06/14/20: 4/10; 08/09/20: 3/10;    Time 8    Period Weeks    Status Partially Met    Target Date 09/06/20      PT LONG TERM GOAL #3   Title Pt will increase strength of by at least 1/2 MMT grade of hip extension and knee flexion bilaterally  in order to demonstrate improvement in strength and function.    Baseline 05/17/20: L+R hip extension: 4+/5, L+R knee flexion: 4+5; 06/14/20: R/L hip extension: 5/5, knee flexion: 5/5     Time 8    Period Weeks    Status Achieved      PT LONG TERM GOAL #4   Title Patient improved FOTO score to at least 68 in order to demonstrate significant improvement in his function at home and work    Baseline 05/17/2020: 59; 06/14/20: 63; 08/09/20: 57    Time 8    Period [place]  Status Partially Met    Target Date 09/06/20                 Plan - 08/09/20 1410    Clinical Impression Statement Patient demonstrates excellent motivation during session today.  Updated outcome measures and goals with patient today.  Patient's Modified Oswestry Disability Index score has increased slightly to 30% today which is still improved from his score of 40% at initial evaluation.  His worst reported pain has decreased from 6/10 to 3/10. His FOTO score worsened to 57. Overall patient reports improvement in his back pain however it remains limiting for him and he has scheduled surgery for 08/23/20. He has continued with therapy once/week prior to surgery to strengthen prior to the procedure. Pt denies any pain during session today. Patient encouraged to continue his HEP and follow-up as scheduled.  He has one PT visit let prior to his back surgery and will be discharged at his next visit.    Personal Factors and Comorbidities Comorbidity 2    Comorbidities HTN, Previous stroke    Examination-Activity Limitations Bend;Lift;Sit;Stand    Examination-Participation Restrictions Occupation;Other   Hobbies such as fishing and hunting   Stability/Clinical Decision Making Evolving/Moderate complexity    Rehab Potential Good    PT Frequency 2x / week    PT Duration 8 weeks    PT Treatment/Interventions Cryotherapy;Electrical Stimulation;Iontophoresis 62m/ml Dexamethasone;Moist Heat;Traction;Gait training;Stair training;Functional mobility training;Therapeutic activities;Therapeutic exercise;Balance training;Neuromuscular re-education;Manual techniques;Passive range of motion;Dry needling;Spinal Manipulations;Joint  Manipulations;Canalith Repostioning;Ultrasound;Patient/family education;Vestibular    PT Next Visit Plan Continue with strengthening, update HEP as needed    PT Home Exercise Plan Access Code: [email or web address]   Consulted and Agree with Plan of Care Patient           Patient will benefit from skilled therapeutic intervention in order to improve the following deficits and impairments:  Abnormal gait,Hypomobility,Increased muscle spasms,Impaired sensation,Decreased range of motion,Decreased activity tolerance,Decreased strength,Pain  Visit Diagnosis: Chronic bilateral low back pain with bilateral sciatica     Problem List Patient Active Problem List   Diagnosis Date Noted  . 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 Chronic right-sided low back pain with right-sided sciatica 09/02/2019  . Abnormal ECG 10/30/2018  . Heart palpitations 10/30/2018  . SOB (shortness of breath) on exertion 10/30/2018  . DDD (degenerative disc disease), lumbar 06/13/2017  . Obesity (BMI 30.0-34.9) 06/13/2017  . Headache disorder 03/23/2015  . Cerebral infarction ([place] 11/30/2012  . Tests ordered 11/30/2012  . Essential hypertension 07/06/2012  . Hypercholesterolemia 07/06/2012  . Snuff user 07/06/2012    JPhillips GroutPT, DPT, GCS  Jerome [name], Jerome [name]  [place] [facility] [place] [address]Phone: [phone]  Fax:  [phone] Name: KBROWN DUNLAPMRN: [id number] of Birth: 1June 14, 1977

## 2020-08-11 ENCOUNTER — Encounter: Payer: Self-pay | Admitting: Neurosurgery

## 2020-08-11 ENCOUNTER — Other Ambulatory Visit: Payer: Self-pay

## 2020-08-11 ENCOUNTER — Encounter
Admission: RE | Admit: 2020-08-11 | Discharge: 2020-08-11 | Disposition: A | Discharge status: Home / Self Care | Payer: BC Managed Care – PPO | Source: Ambulatory Visit | Attending: Neurosurgery | Admitting: Neurosurgery

## 2020-08-11 DIAGNOSIS — Z01818 Encounter for other preprocedural examination: Secondary | ICD-10-CM | POA: Diagnosis not present

## 2020-08-11 HISTORY — DX: Cerebral infarction, unspecified: I63.9

## 2020-08-11 HISTORY — DX: Family history of other specified conditions: Z84.89

## 2020-08-11 HISTORY — DX: Personal history of urinary calculi: Z87.442

## 2020-08-11 HISTORY — DX: COVID-19: U07.1

## 2020-08-11 HISTORY — DX: Unspecified osteoarthritis, unspecified site: M19.90

## 2020-08-11 HISTORY — DX: Ventricular premature depolarization: I49.3

## 2020-08-11 LAB — CBC
HCT: 43.6 % (ref 39.0–52.0)
Hemoglobin: 14.7 g/dL (ref 13.0–17.0)
MCH: 28.5 pg (ref 26.0–34.0)
MCHC: 33.7 g/dL (ref 30.0–36.0)
MCV: 84.5 fL (ref 80.0–100.0)
Platelets: 308 10*3/uL (ref 150–400)
RBC: 5.16 MIL/uL (ref 4.22–5.81)
RDW: 13.4 % (ref 11.5–15.5)
WBC: 8 10*3/uL (ref 4.0–10.5)
nRBC: 0 % (ref 0.0–0.2)

## 2020-08-11 LAB — BASIC METABOLIC PANEL
Anion gap: 9 (ref 5–15)
BUN: 14 mg/dL (ref 6–20)
CO2: 27 mmol/L (ref 22–32)
Calcium: 9.3 mg/dL (ref 8.9–10.3)
Chloride: 102 mmol/L (ref 98–111)
Creatinine, Ser: 0.75 mg/dL (ref 0.61–1.24)
GFR, Estimated: >60 mL/min (ref >60)
Glucose, Bld: 127 mg/dL — ABNORMAL HIGH (ref 70–99)
Potassium: 3.5 mmol/L (ref 3.5–5.1)
Sodium: 138 mmol/L (ref 135–145)

## 2020-08-11 LAB — TYPE AND SCREEN
ABO/RH(D): O NEG
Antibody Screen: NEGATIVE

## 2020-08-11 LAB — URINALYSIS, ROUTINE W REFLEX MICROSCOPIC
Bacteria, UA: NONE SEEN
Bilirubin Urine: NEGATIVE
Glucose, UA: NEGATIVE mg/dL
Hgb urine dipstick: NEGATIVE
Ketones, ur: NEGATIVE mg/dL
Leukocytes,Ua: NEGATIVE
Nitrite: NEGATIVE
Protein, ur: NEGATIVE mg/dL
Specific Gravity, Urine: 1.018 (ref 1.005–1.030)
pH: 6 (ref 5.0–8.0)

## 2020-08-11 LAB — PROTIME-INR
INR: 1 (ref 0.8–1.2)
Prothrombin Time: 12.7 seconds (ref 11.4–15.2)

## 2020-08-11 LAB — SURGICAL PCR SCREEN
MRSA, PCR: NEGATIVE
Staphylococcus aureus: POSITIVE — AB

## 2020-08-11 LAB — APTT: aPTT: 26 seconds (ref 24–36)

## 2020-08-11 NOTE — Patient Instructions (Signed)
Your procedure is scheduled on: 08/23/20 - Wednesday Report to the Registration Desk on the 1st floor of the [facility]. To find out your arrival time, please call [phone] between 1PM - 3PM on: 08/22/20 - Tuesday  REMEMBER: Instructions that are not followed completely may result in serious medical risk, up to and including death; or upon the discretion of your surgeon and anesthesiologist your surgery may need to be rescheduled.  Do not eat food after midnight the night before surgery.  No gum chewing, lozengers or hard candies.  You may however, drink CLEAR liquids up to 2 hours before you are scheduled to arrive for your surgery. Do not drink anything within 2 hours of your scheduled arrival time.  Clear liquids include: - water  - apple juice without pulp - gatorade (not RED, PURPLE, OR BLUE) - black coffee or tea (Do NOT add milk or creamers to the coffee or tea) Do NOT drink anything that is not on this list.  TAKE THESE MEDICATIONS THE MORNING OF SURGERY WITH A SIP OF WATER:  - metoprolol succinate (TOPROL-XL) 100 MG 24 hr tablet - ezetimibe (ZETIA) 10 MG tablet   One week prior to surgery: Stop taking beginning 08/16/20 Stop Anti-inflammatories (NSAIDS) such as Advil, Aleve, Ibuprofen, Motrin, Naproxen, Naprosyn and Aspirin based products such as Excedrin, Goodys Powder, BC Powder. You may however, continue to take Tylenol if needed for pain up until the day of surgery.  Stop ANY OVER THE COUNTER supplements until after surgery.  No Alcohol for 24 hours before or after surgery.  No Smoking including e-cigarettes for 24 hours prior to surgery.  No chewable tobacco products for at least 6 hours prior to surgery.  No nicotine patches on the day of surgery.  Do not use any "recreational" drugs for at least a week prior to your surgery.  Please be advised that the combination of cocaine and anesthesia may have negative outcomes, up to and including death. If you test  positive for cocaine, your surgery will be cancelled.  On the morning of surgery brush your teeth with toothpaste and water, you may rinse your mouth with mouthwash if you wish. Do not swallow any toothpaste or mouthwash.  Do not wear jewelry, make-up, hairpins, clips or nail polish.  Do not wear lotions, powders, or perfumes.   Do not shave body from the neck down 48 hours prior to surgery just in case you cut yourself which could leave a site for infection.  Also, freshly shaved skin may become irritated if using the CHG soap.  Contact lenses, hearing aids and dentures may not be worn into surgery.  Do not bring valuables to the hospital. [facility] is not responsible for any missing/lost belongings or valuables.   Use CHG Soap or wipes as directed on instruction sheet.  Notify your doctor if there is any change in your medical condition (cold, fever, infection).  Wear comfortable clothing (specific to your surgery type) to the hospital.  Plan for stool softeners for home use; pain medications have a tendency to cause constipation. You can also help prevent constipation by eating foods high in fiber such as fruits and vegetables and drinking plenty of fluids as your diet allows.  After surgery, you can help prevent lung complications by doing breathing exercises.  Take deep breaths and cough every 1-2 hours. Your doctor may order a device called an Incentive Spirometer to help you take deep breaths. When coughing or sneezing, hold a pillow firmly  against your incision with both hands. This is called "splinting." Doing this helps protect your incision. It also decreases belly discomfort.  If you are being admitted to the hospital overnight, leave your suitcase in the car. After surgery it may be brought to your room.  If you are being discharged the day of surgery, you will not be allowed to drive home. You will need a responsible adult (18 years or older) to drive you home and  stay with you that night.   If you are taking public transportation, you will need to have a responsible adult (18 years or older) with you. Please confirm with your physician that it is acceptable to use public transportation.   Please call the Pre-admissions Testing Dept. at [phone] if you have any questions about these instructions.  Surgery Visitation Policy:  Patients undergoing a surgery or procedure may have one family member or support person with them as long as that person is not COVID-19 positive or experiencing its symptoms.  That person may remain in the waiting area during the procedure.  Inpatient Visitation:    Visiting hours are 7 a.m. to 8 p.m. Inpatients will be allowed two visitors daily. The visitors may change each day during the patient's stay. No visitors under the age of 65. Any visitor under the age of 15 must be accompanied by an adult. The visitor must pass COVID-19 screenings, use hand sanitizer when entering and exiting the patient's room and wear a mask at all times, including in the patient's room. Patients must also wear a mask when staff or their visitor are in the room. Masking is required regardless of vaccination status.  Total Hip/Knee Replacement Preoperative Educational Video  To better prepare for surgery, please view our videos that explain the physical activity and discharge planning required to have the best surgical recovery at [facility].  [email or web address]      Questions? Call [phone] or email [email or web address]

## 2020-08-16 ENCOUNTER — Other Ambulatory Visit: Payer: Self-pay

## 2020-08-16 ENCOUNTER — Ambulatory Visit: Payer: BC Managed Care – PPO | Attending: Physical Medicine & Rehabilitation

## 2020-08-16 DIAGNOSIS — M5442 Lumbago with sciatica, left side: Secondary | ICD-10-CM | POA: Diagnosis not present

## 2020-08-16 DIAGNOSIS — G8929 Other chronic pain: Secondary | ICD-10-CM | POA: Diagnosis present

## 2020-08-16 DIAGNOSIS — M5441 Lumbago with sciatica, right side: Secondary | ICD-10-CM | POA: Insufficient documentation

## 2020-08-16 NOTE — Therapy (Signed)
[facility] [address] [place], [place], [address] Phone: [phone]   Fax:  [phone]  Physical Therapy Treatment/Discharge  Patient Details  Name: Jerome [name] MRN: [id number] Date of Birth: 1975/04/23 Referring Provider (PT): Dr. Girtha Hake   Encounter Date: 08/16/2020   PT End of Session - 08/16/20 1408    Visit Number 21    Number of Visits 33    Date for PT Re-Evaluation 09/06/20    Authorization Type eval: 05/17/20    PT Start Time 1400    PT Stop Time 1445    PT Time Calculation (min) 45 min    Activity Tolerance Patient tolerated treatment well    Behavior During Therapy [facility] for tasks assessed/performed           Past Medical History:  Diagnosis Date  . Arthritis   . COVID-19   . Family history of adverse reaction to anesthesia    mother has open heart surgery had swelling , hard to wake up  . History of kidney stones   . Hyperlipidemia   . Hypertension   . Pneumothorax, spontaneous, tension 2001  . PVC (premature ventricular contraction)   . Stroke [facility])    questionable    Past Surgical History:  Procedure Laterality Date  . HERNIA REPAIR     umbilical hernia  . Pneumothorax- chest tube    . TONSILLECTOMY     uvula    There were no vitals filed for this visit.   Subjective Assessment - 08/16/20 1406    Subjective Patient states that he is doing alright today. Reports 3/10 low back pain upon arrival. Back was aggravated this weekend when boating down at the [place]. Back surgery is scheduled for 08/23/20. No specific/new questions or concerns mentioned.    Pertinent History LBP started a couple of years ago when he sneezed.  Difficulties bending to the floor, numbness down left and right leg and tingling down thighs.  Ischial tuberosity bothering him after first injection.  Has trouble lying on back, more comfortable on stomach.  Has had about 5 injections and then wears off after a couple of  days. Feels like his back is tired and a discomfort constantly.  Hasn't gone back to the gym since injury.  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  Lifting aggravates. 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 Mild to moderate spinal canal/neural foraminal narrowing at this level.  Mild left L2-3 and bilateral L3-4 neural foraminal narrowing.L5-S1 central and left subarticular protrusions with moderate left  and mild right neural foraminal narrowing.  Patient went through physical therapy last year [place] [facility] and reports that it was helpful.    Limitations Sitting;Lifting;Standing    How long can you sit comfortably? 10 minutes at work, 45 minutes in [place] at home    How long can you stand comfortably? 20 minutes    How long can you walk comfortably? unknown    Diagnostic tests MRI, see history    Patient Stated Goals Getting back to routine at the gym    Currently in Pain? Yes    Pain Score 3     Pain Location Back    Pain Orientation Right;Left    Pain Descriptors / Indicators Tightness    Pain Onset More than a month ago  Pain Frequency Constant                TREATMENT   TherapeuticExercise Scifit L4-4.3x 5 minutes for warm-upduring history; Resisted side stepping with green tband around ankles x 4 lengths of gym; Walking lunges with 8# dumbbell overhead x 4 lengths of gym; Nautilus lat pull down with individual grips 125# 2 x 15 Nautilus rows with individual grips 80# 2 x 15 Supine SLR with 4# ankle weights (AW) 2 x 15 BLE; Supine bridges with alternating marches with 4# AW x 15 BLE; Sidelying straight leg hip abduction with 4# AW 2 x 15 BLE;  Quadruped alternating hip extension with 4# AW x 10 BLE; Quadruped bird dogs with 4# AW x 10 BLE; Quadruped fire hydrants with 4#  AW x 10 BLE; Prone front planks1 minute hold/1 minute relax x2; Sideplanks30s hold/30s relax x2on each side   Pt educated throughout session about proper posture and technique with exercises. Improved exercise technique, movement at target joints, use of target muscles after min to mod verbal, visual, tactile cues.    Patient demonstrates excellent motivation during session today.Updatedoutcome measures with patient at last visit. Patient's Modified OswestryDisabilityIndex score was 30% which was improved from his score of 40% at initial evaluation. His worst reported pain has decreased from 6/10 to3/10. His FOTOscore worsened to 57.Overall patient reports improvement in his back pain however it remains limiting for him and he has scheduled surgery for 08/23/20. He has continued with therapy once/week prior to surgery to strengthen prior to the procedure. Today is his last therapy session. Pt encouraged to contact therapist if he has any needs after surgery. Pt will be discharged on this date.                             PT Short Term Goals - 08/16/20 2021      PT SHORT TERM GOAL #1   Title Pt will be independent with HEP in order to improve strength and decrease back pain in order to improve pain-free function at home and work    Time 4    Period Weeks    Status Achieved             PT Long Term Goals - 08/16/20 2021      PT LONG TERM GOAL #1   Title Pt will decrease mODI score to less than 14% in order demonstrate clinically significant reduction in back pain/disability.    Baseline 05/17/20: 40%; 06/14/20: 26%; 08/09/20: 30%    Time 8    Period Weeks    Status Partially Met      PT LONG TERM GOAL #2   Title Pt will decrease worst back pain as reported on NPRS to no more than 2/10 in order to demonstrate clinically significant reduction in back pain.    Baseline 05/17/20: 6/10; 06/14/20: 4/10; 08/09/20: 3/10;    Time 8    Period Weeks     Status Partially Met      PT LONG TERM GOAL #3   Title Pt will increase strength of by at least 1/2 MMT grade of hip extension and knee flexion bilaterally  in order to demonstrate improvement in strength and function.    Baseline 05/17/20: L+R hip extension: 4+/5, L+R knee flexion: 4+5; 06/14/20: R/L hip extension: 5/5, knee flexion: 5/5    Time 8    Period Weeks    Status Achieved  PT LONG TERM GOAL #4   Title Patient improved FOTO score to at least 68 in order to demonstrate significant improvement in his function at home and work    Baseline 05/17/2020: 59; 06/14/20: 63; 08/09/20: 57    Time 8    Period Weeks    Status Partially Met                 Plan - 08/16/20 1408    Clinical Impression Statement Patient demonstrates excellent motivation during session today.  Updated outcome measures with patient at last visit. Patient's Modified Oswestry Disability Index score was 30% which was improved from his score of 40% at initial evaluation.  His worst reported pain has decreased from 6/10 to 3/10. His FOTO score worsened to 57. Overall patient reports improvement in his back pain however it remains limiting for him and he has scheduled surgery for 08/23/20. He has continued with therapy once/week prior to surgery to strengthen prior to the procedure. Today is his last therapy session. Pt encouraged to contact therapist if he has any needs after surgery. Pt will be discharged on this date.    Personal Factors and Comorbidities Comorbidity 2    Comorbidities HTN, Previous stroke    Examination-Activity Limitations Bend;Lift;Sit;Stand    Examination-Participation Restrictions Occupation;Other   Hobbies such as fishing and hunting   Stability/Clinical Decision Making Evolving/Moderate complexity    Rehab Potential Good    PT Frequency 2x / week    PT Duration 8 weeks    PT Treatment/Interventions Cryotherapy;Electrical Stimulation;Iontophoresis 32m/ml Dexamethasone;Moist Heat;Traction;Gait  training;Stair training;Functional mobility training;Therapeutic activities;Therapeutic exercise;Balance training;Neuromuscular re-education;Manual techniques;Passive range of motion;Dry needling;Spinal Manipulations;Joint Manipulations;Canalith Repostioning;Ultrasound;Patient/family education;Vestibular    PT Next Visit Plan Discharge    PT Home Exercise Plan Access Code: [email or web address]    IRSWNIOEVand Agree with Plan of Care Patient           Patient will benefit from skilled therapeutic intervention in order to improve the following deficits and impairments:  Abnormal gait,Hypomobility,Increased muscle spasms,Impaired sensation,Decreased range of motion,Decreased activity tolerance,Decreased strength,Pain  Visit Diagnosis: Chronic bilateral low back pain with bilateral sciatica     Problem List Patient Active Problem List   Diagnosis Date Noted  . 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 Chronic right-sided low back pain with right-sided sciatica 09/02/2019  . Abnormal ECG 10/30/2018  . Heart palpitations 10/30/2018  . SOB (shortness of breath) on exertion 10/30/2018  . DDD (degenerative disc disease), lumbar 06/13/2017  . Obesity (BMI 30.0-34.9) 06/13/2017  . Headache disorder 03/23/2015  . Cerebral infarction ([place] 11/30/2012  . Tests ordered 11/30/2012  . Essential hypertension 07/06/2012  . Hypercholesterolemia 07/06/2012  . Snuff user 07/06/2012   JPhillips GroutPT, DPT, GCS  Luretha Eberly 08/16/2020, 8:28 PM  [facility] [place] [address]Phone: [phone]  Fax:  [phone] Name: KZARED KNOTHMRN: [id number] of Birth: [birth year]

## 2020-08-22 MED ORDER — ORAL CARE MOUTH RINSE: 15.0000 mL | Freq: Once | OROMUCOSAL | Status: AC

## 2020-08-22 MED ORDER — CEFAZOLIN IN SODIUM CHLORIDE 3-0.9 GM/100ML-% IV SOLN
3.0000 g | INTRAVENOUS | Status: DC
  Filled 2020-08-22: qty 100

## 2020-08-22 MED ORDER — CHLORHEXIDINE GLUCONATE 0.12 % MT SOLN
15.0000 mL | Freq: Once | OROMUCOSAL | Status: AC
  Administered 2020-08-23: 15 mL via OROMUCOSAL

## 2020-08-22 MED ORDER — FAMOTIDINE 20 MG PO TABS
20.0000 mg | ORAL_TABLET | Freq: Once | ORAL | Status: AC
  Administered 2020-08-23: 20 mg via ORAL

## 2020-08-22 MED ORDER — LACTATED RINGERS IV SOLN: INTRAVENOUS | Status: DC

## 2020-08-22 NOTE — Progress Notes (Signed)
Pharmacy Antibiotic Note  MATS JEANLOUIS is a 45 y.o. male admitted on (Not on file) with surgical prophylaxis.  Pharmacy has been consulted for Cefazolin dosing.  TBW = 122.3 kg   Plan: Cefazolin 3 gm IV X 1 60 min pre-op for 6/8 @ 0500.      No data recorded.  No results for input(s): WBC, CREATININE, LATICACIDVEN, VANCOTROUGH, VANCOPEAK, VANCORANDOM, GENTTROUGH, GENTPEAK, GENTRANDOM, TOBRATROUGH, TOBRAPEAK, TOBRARND, AMIKACINPEAK, AMIKACINTROU, AMIKACIN in the last 168 hours.  Estimated Creatinine Clearance: 158.1 mL/min (by C-G formula based on SCr of 0.75 mg/dL).    Allergies  Allergen Reactions  . Atorvastatin     Other reaction(s): Muscle Pain Elevated CPK  . Simvastatin Rash    Elevated CPK Other reaction(s): Muscle Pain Elevated CPK    Antimicrobials this admission:   >>    >>   Dose adjustments this admission:   Microbiology results:  BCx:   UCx:    Sputum:    MRSA PCR:   Thank you for allowing pharmacy to be a part of this patient's care.  Haelee Bolen D 08/22/2020 11:18 PM

## 2020-08-23 ENCOUNTER — Encounter: Payer: Self-pay | Admitting: Neurosurgery

## 2020-08-23 ENCOUNTER — Ambulatory Visit: Payer: BC Managed Care – PPO | Admitting: Urgent Care

## 2020-08-23 ENCOUNTER — Other Ambulatory Visit: Payer: Self-pay

## 2020-08-23 ENCOUNTER — Ambulatory Visit
Admission: RE | Admit: 2020-08-23 | Discharge: 2020-08-23 | Disposition: A | Discharge status: Home / Self Care | Payer: BC Managed Care – PPO | Attending: Neurosurgery | Admitting: Neurosurgery

## 2020-08-23 ENCOUNTER — Encounter
Admission: RE | Disposition: A | Discharge status: Home / Self Care | Payer: Self-pay | Source: Home / Self Care | Attending: Neurosurgery

## 2020-08-23 ENCOUNTER — Ambulatory Visit: Payer: BC Managed Care – PPO

## 2020-08-23 DIAGNOSIS — Z888 Allergy status to other drugs, medicaments and biological substances status: Secondary | ICD-10-CM | POA: Insufficient documentation

## 2020-08-23 DIAGNOSIS — Z720 Tobacco use: Secondary | ICD-10-CM | POA: Insufficient documentation

## 2020-08-23 DIAGNOSIS — M4727 Other spondylosis with radiculopathy, lumbosacral region: Secondary | ICD-10-CM | POA: Insufficient documentation

## 2020-08-23 DIAGNOSIS — M5117 Intervertebral disc disorders with radiculopathy, lumbosacral region: Secondary | ICD-10-CM | POA: Insufficient documentation

## 2020-08-23 DIAGNOSIS — Z79899 Other long term (current) drug therapy: Secondary | ICD-10-CM | POA: Insufficient documentation

## 2020-08-23 DIAGNOSIS — M5116 Intervertebral disc disorders with radiculopathy, lumbar region: Secondary | ICD-10-CM | POA: Insufficient documentation

## 2020-08-23 DIAGNOSIS — M5416 Radiculopathy, lumbar region: Secondary | ICD-10-CM | POA: Diagnosis present

## 2020-08-23 DIAGNOSIS — Z419 Encounter for procedure for purposes other than remedying health state, unspecified: Secondary | ICD-10-CM

## 2020-08-23 DIAGNOSIS — M48061 Spinal stenosis, lumbar region without neurogenic claudication: Secondary | ICD-10-CM | POA: Diagnosis not present

## 2020-08-23 HISTORY — PX: LUMBAR LAMINECTOMY/DECOMPRESSION MICRODISCECTOMY: SHX5026

## 2020-08-23 LAB — ABO/RH: ABO/RH(D): O NEG

## 2020-08-23 SURGERY — LUMBAR LAMINECTOMY/DECOMPRESSION MICRODISCECTOMY 1 LEVEL
Anesthesia: General | Laterality: Right

## 2020-08-23 MED ORDER — PROPOFOL 10 MG/ML IV BOLUS
INTRAVENOUS | Status: AC
  Filled 2020-08-23: qty 40

## 2020-08-23 MED ORDER — BUPIVACAINE HCL 0.5 % IJ SOLN
INTRAMUSCULAR | Status: DC | PRN
  Administered 2020-08-23: 20 mL

## 2020-08-23 MED ORDER — ONDANSETRON HCL 4 MG/2ML IJ SOLN: 4.0000 mg | Freq: Once | INTRAMUSCULAR | Status: DC | PRN

## 2020-08-23 MED ORDER — FENTANYL CITRATE (PF) 100 MCG/2ML IJ SOLN
INTRAMUSCULAR | Status: AC
  Administered 2020-08-23: 50 ug via INTRAVENOUS
  Filled 2020-08-23: qty 2

## 2020-08-23 MED ORDER — BUPIVACAINE-EPINEPHRINE (PF) 0.5% -1:200000 IJ SOLN
INTRAMUSCULAR | Status: DC | PRN
  Administered 2020-08-23: 4 mL

## 2020-08-23 MED ORDER — ONDANSETRON HCL 4 MG/2ML IJ SOLN
INTRAMUSCULAR | Status: AC
  Filled 2020-08-23: qty 2

## 2020-08-23 MED ORDER — PROPOFOL 10 MG/ML IV BOLUS
INTRAVENOUS | Status: DC | PRN
  Administered 2020-08-23: 200 mg via INTRAVENOUS

## 2020-08-23 MED ORDER — KETOROLAC TROMETHAMINE 30 MG/ML IJ SOLN
INTRAMUSCULAR | Status: DC | PRN
  Administered 2020-08-23: 30 mg via INTRAVENOUS

## 2020-08-23 MED ORDER — SUCCINYLCHOLINE CHLORIDE 20 MG/ML IJ SOLN
INTRAMUSCULAR | Status: DC | PRN
  Administered 2020-08-23: 140 mg via INTRAVENOUS

## 2020-08-23 MED ORDER — SODIUM CHLORIDE 0.9 % IV SOLN
INTRAVENOUS | Status: DC | PRN
  Administered 2020-08-23: 10 ug/min via INTRAVENOUS

## 2020-08-23 MED ORDER — LIDOCAINE HCL (CARDIAC) PF 100 MG/5ML IV SOSY
PREFILLED_SYRINGE | INTRAVENOUS | Status: DC | PRN
  Administered 2020-08-23: 100 mg via INTRAVENOUS

## 2020-08-23 MED ORDER — SUCCINYLCHOLINE CHLORIDE 200 MG/10ML IV SOSY
PREFILLED_SYRINGE | INTRAVENOUS | Status: AC
  Filled 2020-08-23: qty 10

## 2020-08-23 MED ORDER — THROMBIN 5000 UNITS EX SOLR
CUTANEOUS | Status: DC | PRN
  Administered 2020-08-23: 5000 [IU] via TOPICAL

## 2020-08-23 MED ORDER — DEXAMETHASONE SODIUM PHOSPHATE 10 MG/ML IJ SOLN
INTRAMUSCULAR | Status: AC
  Filled 2020-08-23: qty 1

## 2020-08-23 MED ORDER — FAMOTIDINE 20 MG PO TABS
ORAL_TABLET | ORAL | Status: AC
  Filled 2020-08-23: qty 1

## 2020-08-23 MED ORDER — PHENYLEPHRINE HCL (PRESSORS) 10 MG/ML IV SOLN
INTRAVENOUS | Status: AC
  Filled 2020-08-23: qty 1

## 2020-08-23 MED ORDER — FENTANYL CITRATE (PF) 100 MCG/2ML IJ SOLN
INTRAMUSCULAR | Status: AC
  Filled 2020-08-23: qty 2

## 2020-08-23 MED ORDER — DEXTROSE 5 % IV SOLN
INTRAVENOUS | Status: DC | PRN
  Administered 2020-08-23: 3 g via INTRAVENOUS

## 2020-08-23 MED ORDER — FENTANYL CITRATE (PF) 100 MCG/2ML IJ SOLN: 25.0000 ug | INTRAMUSCULAR | Status: DC | PRN

## 2020-08-23 MED ORDER — LIDOCAINE HCL (PF) 2 % IJ SOLN
INTRAMUSCULAR | Status: AC
  Filled 2020-08-23: qty 5

## 2020-08-23 MED ORDER — ACETAMINOPHEN 10 MG/ML IV SOLN
INTRAVENOUS | Status: DC | PRN
  Administered 2020-08-23: 1000 mg via INTRAVENOUS

## 2020-08-23 MED ORDER — ACETAMINOPHEN 10 MG/ML IV SOLN
INTRAVENOUS | Status: AC
  Filled 2020-08-23: qty 100

## 2020-08-23 MED ORDER — MIDAZOLAM HCL 2 MG/2ML IJ SOLN
INTRAMUSCULAR | Status: AC
  Filled 2020-08-23: qty 2

## 2020-08-23 MED ORDER — KETAMINE HCL 50 MG/ML IJ SOLN
INTRAMUSCULAR | Status: AC
  Filled 2020-08-23: qty 1

## 2020-08-23 MED ORDER — PHENYLEPHRINE HCL (PRESSORS) 10 MG/ML IV SOLN
INTRAVENOUS | Status: DC | PRN
  Administered 2020-08-23: 200 ug via INTRAVENOUS
  Administered 2020-08-23 (×2): 100 ug via INTRAVENOUS

## 2020-08-23 MED ORDER — FENTANYL CITRATE (PF) 100 MCG/2ML IJ SOLN
INTRAMUSCULAR | Status: DC | PRN
  Administered 2020-08-23 (×3): 50 ug via INTRAVENOUS

## 2020-08-23 MED ORDER — LABETALOL HCL 5 MG/ML IV SOLN: 10.0000 mg | Freq: Once | INTRAVENOUS | Status: AC

## 2020-08-23 MED ORDER — METHOCARBAMOL 500 MG PO TABS: 500.0000 mg | ORAL_TABLET | Freq: Four times a day (QID) | ORAL | 0 refills | Status: AC | PRN

## 2020-08-23 MED ORDER — METHYLPREDNISOLONE ACETATE 40 MG/ML IJ SUSP
INTRAMUSCULAR | Status: DC | PRN
  Administered 2020-08-23: 40 mg

## 2020-08-23 MED ORDER — MIDAZOLAM HCL 2 MG/2ML IJ SOLN
INTRAMUSCULAR | Status: DC | PRN
  Administered 2020-08-23: 2 mg via INTRAVENOUS

## 2020-08-23 MED ORDER — OXYCODONE HCL 5 MG/5ML PO SOLN: 5.0000 mg | Freq: Once | ORAL | Status: DC | PRN

## 2020-08-23 MED ORDER — DEXAMETHASONE SODIUM PHOSPHATE 10 MG/ML IJ SOLN
INTRAMUSCULAR | Status: DC | PRN
  Administered 2020-08-23: 10 mg via INTRAVENOUS

## 2020-08-23 MED ORDER — ONDANSETRON HCL 4 MG/2ML IJ SOLN
INTRAMUSCULAR | Status: DC | PRN
  Administered 2020-08-23: 4 mg via INTRAVENOUS

## 2020-08-23 MED ORDER — SODIUM CHLORIDE 0.9 % IV SOLN
INTRAVENOUS | Status: DC | PRN
  Administered 2020-08-23: 40 mL

## 2020-08-23 MED ORDER — OXYCODONE HCL 5 MG PO TABS: 5.0000 mg | ORAL_TABLET | Freq: Three times a day (TID) | ORAL | 0 refills | Status: AC | PRN

## 2020-08-23 MED ORDER — OXYCODONE HCL 5 MG PO TABS
5.0000 mg | ORAL_TABLET | Freq: Once | ORAL | Status: DC | PRN
Start: 2020-08-23 — End: 2020-08-23

## 2020-08-23 MED ORDER — CHLORHEXIDINE GLUCONATE 0.12 % MT SOLN
OROMUCOSAL | Status: AC
  Filled 2020-08-23: qty 15

## 2020-08-23 MED ORDER — KETAMINE HCL 50 MG/ML IJ SOLN
INTRAMUSCULAR | Status: DC | PRN
  Administered 2020-08-23: 50 mg via INTRAMUSCULAR

## 2020-08-23 MED ORDER — LABETALOL HCL 5 MG/ML IV SOLN
INTRAVENOUS | Status: AC
  Administered 2020-08-23: 10 mg via INTRAVENOUS
  Filled 2020-08-23: qty 4

## 2020-08-23 MED ORDER — ACETAMINOPHEN 10 MG/ML IV SOLN: 1000.0000 mg | Freq: Once | INTRAVENOUS | Status: DC | PRN

## 2020-08-23 SURGICAL SUPPLY — 55 items
ADH SKN CLS APL DERMABOND .7 (GAUZE/BANDAGES/DRESSINGS) ×1
AGENT HMST MTR 8 SURGIFLO (HEMOSTASIS) ×1
APL PRP STRL LF DISP 70% ISPRP (MISCELLANEOUS) ×2
BUR NEURO DRILL SOFT 3.0X3.8M (BURR) ×3 IMPLANT
CHLORAPREP W/TINT 26 (MISCELLANEOUS) ×6 IMPLANT
CNTNR SPEC 2.5X3XGRAD LEK (MISCELLANEOUS) ×1
CONT SPEC 4OZ STER OR WHT (MISCELLANEOUS) ×2
CONT SPEC 4OZ STRL OR WHT (MISCELLANEOUS) ×1
CONTAINER SPEC 2.5X3XGRAD LEK (MISCELLANEOUS) ×1 IMPLANT
COUNTER NEEDLE 20/40 LG (NEEDLE) ×3 IMPLANT
COVER WAND RF STERILE (DRAPES) ×3 IMPLANT
CUP MEDICINE 2OZ PLAST GRAD ST (MISCELLANEOUS) ×6 IMPLANT
DERMABOND ADVANCED (GAUZE/BANDAGES/DRESSINGS) ×2
DERMABOND ADVANCED .7 DNX12 (GAUZE/BANDAGES/DRESSINGS) ×1 IMPLANT
DRAPE C ARM PK CFD 31 SPINE (DRAPES) ×3 IMPLANT
DRAPE LAPAROTOMY 100X77 ABD (DRAPES) ×3 IMPLANT
DRAPE MICROSCOPE SPINE 48X150 (DRAPES) ×3 IMPLANT
DRAPE SURG 17X11 SM STRL (DRAPES) ×12 IMPLANT
DRSG OPSITE POSTOP 4X6 (GAUZE/BANDAGES/DRESSINGS) ×3 IMPLANT
ELECT CAUTERY BLADE TIP 2.5 (TIP) ×3
ELECT EZSTD 165MM 6.5IN (MISCELLANEOUS)
ELECT REM PT RETURN 9FT ADLT (ELECTROSURGICAL) ×3
ELECTRODE CAUTERY BLDE TIP 2.5 (TIP) ×1 IMPLANT
ELECTRODE EZSTD 165MM 6.5IN (MISCELLANEOUS) IMPLANT
ELECTRODE REM PT RTRN 9FT ADLT (ELECTROSURGICAL) ×1 IMPLANT
GLOVE SURG SYN 8.5  E (GLOVE) ×6
GLOVE SURG SYN 8.5 E (GLOVE) ×3 IMPLANT
GOWN SRG XL LVL 3 NONREINFORCE (GOWNS) ×1 IMPLANT
GOWN STRL NON-REIN TWL XL LVL3 (GOWNS) ×3
GOWN STRL REUS W/ TWL XL LVL3 (GOWN DISPOSABLE) ×1 IMPLANT
GOWN STRL REUS W/TWL XL LVL3 (GOWN DISPOSABLE) ×3
GRADUATE 1200CC STRL 31836 (MISCELLANEOUS) ×3 IMPLANT
KIT SPINAL PRONEVIEW (KITS) ×3 IMPLANT
KNIFE BAYONET SHORT DISCETOMY (MISCELLANEOUS) IMPLANT
MANIFOLD NEPTUNE II (INSTRUMENTS) ×3 IMPLANT
MARKER SKIN DUAL TIP RULER LAB (MISCELLANEOUS) ×6 IMPLANT
NDL SAFETY ECLIPSE 18X1.5 (NEEDLE) ×1 IMPLANT
NEEDLE HYPO 18GX1.5 SHARP (NEEDLE) ×3
NEEDLE HYPO 22GX1.5 SAFETY (NEEDLE) ×3 IMPLANT
NS IRRIG 1000ML POUR BTL (IV SOLUTION) ×3 IMPLANT
PACK LAMINECTOMY NEURO (CUSTOM PROCEDURE TRAY) ×3 IMPLANT
PAD ARMBOARD 7.5X6 YLW CONV (MISCELLANEOUS) ×3 IMPLANT
SPOGE SURGIFLO 8M (HEMOSTASIS) ×2
SPONGE SURGIFLO 8M (HEMOSTASIS) ×1 IMPLANT
SUT DVC VLOC 3-0 CL 6 P-12 (SUTURE) ×3 IMPLANT
SUT VIC AB 0 CT1 27 (SUTURE) ×3
SUT VIC AB 0 CT1 27XCR 8 STRN (SUTURE) ×1 IMPLANT
SUT VIC AB 2-0 CT1 18 (SUTURE) ×3 IMPLANT
SYR 10ML LL (SYRINGE) ×3 IMPLANT
SYR 20ML LL LF (SYRINGE) ×3 IMPLANT
SYR 30ML LL (SYRINGE) ×6 IMPLANT
SYR 3ML LL SCALE MARK (SYRINGE) ×3 IMPLANT
TOWEL OR 17X26 4PK STRL BLUE (TOWEL DISPOSABLE) ×9 IMPLANT
TUBING CONNECTING 10 (TUBING) ×2 IMPLANT
TUBING CONNECTING 10' (TUBING) ×1

## 2020-08-23 NOTE — Discharge Summary (Signed)
Physician Discharge Summary  Patient ID: Jerome [name] MRN: [id number] DOB/AGE: Mar 15, 1976 45 y.o.  Admit date: 08/23/2020 Discharge date: 08/23/2020  Admission Diagnoses: lumbar radiculopathy  Discharge Diagnoses:  Active Problems:   * No active hospital problems. *   Discharged Condition: good  Hospital Course:  Jerome [name] presented for outpatient microdiscectomy.  The surgery went well and he was discharged home.  Consults: None  Significant Diagnostic Studies: radiology: X-Ray: localization in OR  Treatments: surgery: R L4-5 microdiscectomy  Discharge Exam: Blood pressure (!) 147/98, pulse 95, temperature (!) 96.9 F (36.1 C), temperature source Oral, resp. rate 20, height 6\' 1"  (1.854 m), weight 122 kg, SpO2 98 %. General appearance: alert and cooperative  CNI MAEW  Disposition: Discharge disposition: 01-Home or Self Care        Allergies as of 08/23/2020      Reactions   Atorvastatin    Other reaction(s): Muscle Pain Elevated CPK   Simvastatin Rash   Elevated CPK Other reaction(s): Muscle Pain Elevated CPK      Medication List    STOP taking these medications   tadalafil 20 MG tablet Commonly known as: CIALIS     TAKE these medications   acetaminophen 500 MG tablet Commonly known as: TYLENOL Take 1,000 mg by mouth every 8 (eight) hours as needed for moderate pain.   ezetimibe 10 MG tablet Commonly known as: ZETIA Take 10 mg by mouth daily.   ibuprofen 200 MG tablet Commonly known as: ADVIL Take 800 mg by mouth every 8 (eight) hours as needed for moderate pain.   lisinopril-hydrochlorothiazide 20-25 MG tablet Commonly known as: ZESTORETIC Take 1 tablet by mouth daily.   methocarbamol 500 MG tablet Commonly known as: Robaxin Take 1 tablet (500 mg total) by mouth every 6 (six) hours as needed for muscle spasms.   metoprolol succinate 100 MG 24 hr tablet Commonly known as: TOPROL-XL Take 100 mg by mouth daily. At lunchtime   oxyCODONE  5 MG immediate release tablet Commonly known as: Roxicodone Take 1 tablet (5 mg total) by mouth every 8 (eight) hours as needed for up to 10 days for moderate pain.   sildenafil 20 MG tablet Commonly known as: REVATIO Take 20 mg by mouth daily.       Follow-up Information    10/23/2020, MD In 2 weeks.   Specialty: Neurosurgery Contact information: [address] [place] [id number]               Signed: [phone] 08/23/2020, 9:51 AM

## 2020-08-23 NOTE — Discharge Instructions (Addendum)
**DO NOT REMOVE TEAL EXPAREL BRACELET FOR 4 DAYS (96 hours)** If you return to the hospital for any reason within 96 hours following the administration of EXPAREL, it is important for health care providers to know that you have received this anesthetic. A teal colored band has been placed on your arm with the date, time and amount of EXPAREL you have received in order to alert and inform your health care providers. Please leave this armband in place for the full 96 hours following administration, and then you may remove the band.Information for Discharge Teaching: EXPAREL (bupivacaine liposome injectable suspension)   Your surgeon or anesthesiologist gave you EXPAREL(bupivacaine) to help control your pain after surgery.   EXPAREL is a local anesthetic that provides pain relief by numbing the tissue around the surgical site.  EXPAREL is designed to release pain medication over time and can control pain for up to 72 hours.  Depending on how you respond to EXPAREL, you may require less pain medication during your recovery.  Possible side effects:  Temporary loss of sensation or ability to move in the area where bupivacaine was injected.  Nausea, vomiting, constipation  Rarely, numbness and tingling in your mouth or lips, lightheadedness, or anxiety may occur.  Call your doctor right away if you think you may be experiencing any of these sensations, or if you have other questions regarding possible side effects.  Follow all other discharge instructions given to you by your surgeon or nurse. Eat a healthy diet and drink plenty of water or other fluids.  If you return to the hospital for any reason within 96 hours following the administration of EXPAREL, it is important for health care providers to know that you have received this anesthetic. A teal colored band has been placed on your arm with the date, time and amount of EXPAREL you have received in order to alert and inform your health care  providers. Please leave this armband in place for the full 96 hours following administration, and then you may remove the band. Your surgeon has performed an operation on your lumbar spine (low back) to relieve pressure on one or more nerves. Many times, patients feel better immediately after surgery and can "overdo it." Even if you feel well, it is important that you follow these activity guidelines. If you do not let your back heal properly from the surgery, you can increase the chance of a disc herniation and/or return of your symptoms. The following are instructions to help in your recovery once you have been discharged from the hospital.  * OK to take antiinflammatory medications.  Activity    No bending, lifting, or twisting ("BLT"). Avoid lifting objects heavier than 10 pounds (gallon milk jug).  Where possible, avoid household activities that involve lifting, bending, pushing, or pulling such as laundry, vacuuming, grocery shopping, and childcare. Try to arrange for help from friends and family for these activities while your back heals.  Increase physical activity slowly as tolerated.  Taking short walks is encouraged, but avoid strenuous exercise. Do not jog, run, bicycle, lift weights, or participate in any other exercises unless specifically allowed by your doctor. Avoid prolonged sitting, including car rides.  Talk to your doctor before resuming sexual activity.  You should not drive until cleared by your doctor.  Until released by your doctor, you should not return to work or school.  You should rest at home and let your body heal.   You may shower two days after your surgery.  After showering, lightly dab your incision dry. Do not take a tub bath or go swimming for 3 weeks, or until approved by your doctor at your follow-up appointment.  If you smoke, we strongly recommend that you quit.  Smoking has been proven to interfere with normal healing in your back and will dramatically  reduce the success rate of your surgery. Please contact [facility] (800-QUIT-NOW) and use the resources at www.[facility].com for assistance in stopping smoking.  Surgical Incision   If you have a dressing on your incision, you may remove it three days after your surgery. Keep your incision area clean and dry.  If you have staples or stitches on your incision, you should have a follow up scheduled for removal. If you do not have staples or stitches, you will have steri-strips (small pieces of surgical tape) or Dermabond glue. The steri-strips/glue should begin to peel away within about a week (it is fine if the steri-strips fall off before then). If the strips are still in place one week after your surgery, you may gently remove them.  Diet            You may return to your usual diet. Be sure to stay hydrated.  When to Contact us  Although your surgery and recovery will likely be uneventful, you may have some residual numbness, aches, and pains in your back and/or legs. This is normal and should improve in the next few weeks.  However, should you experience any of the following, contact us immediately: . New numbness or weakness . Pain that is progressively getting worse, and is not relieved by your pain medications or rest . Bleeding, redness, swelling, pain, or drainage from surgical incision . Chills or flu-like symptoms . Fever greater than 101.0 F (38.3 C) . Problems with bowel or bladder functions . Difficulty breathing or shortness of breath . Warmth, tenderness, or swelling in your calf  Contact Information . During office hours (Monday-Friday 9 am to 5 pm), please call your physician at [phone] . After hours and weekends, please call [phone] and speak with the answering service, who will contact the doctor on call.  If that fails, call the [facility] Operator at [phone] and ask for the Neurosurgery Resident On Call  . For a life-threatening emergency, call  [phone]    AMBULATORY SURGERY  DISCHARGE INSTRUCTIONS   1) The drugs that you were given will stay in your system until tomorrow so for the next 24 hours you should not:  A) Drive an automobile B) Make any legal decisions C) Drink any alcoholic beverage   2) You may resume regular meals tomorrow.  Today it is better to start with liquids and gradually work up to solid foods.  You may eat anything you prefer, but it is better to start with liquids, then soup and crackers, and gradually work up to solid foods.   3) Please notify your doctor immediately if you have any unusual bleeding, trouble breathing, redness and pain at the surgery site, drainage, fever, or pain not relieved by medication.    Please contact your physician with any problems or Same Day Surgery at [phone], Monday through Friday 6 am to 4 pm, or [place] at [facility] number at [phone].

## 2020-08-23 NOTE — Anesthesia Postprocedure Evaluation (Signed)
Anesthesia Post Note  Patient: Jerome [name]  Procedure(s) Performed: RIGHT L4-5 MICRODISCECTOMY (Right )  Patient location during evaluation: PACU Anesthesia Type: General Level of consciousness: awake and alert Pain management: pain level controlled Vital Signs Assessment: post-procedure vital signs reviewed and stable Respiratory status: spontaneous breathing, nonlabored ventilation, respiratory function stable and patient connected to nasal cannula oxygen Cardiovascular status: blood pressure returned to baseline and stable Postop Assessment: no apparent nausea or vomiting Anesthetic complications: no   No complications documented.   Last Vitals:  Vitals:   08/23/20 1041 08/23/20 1050  BP: (!) 136/97 (!) 155/94  Pulse: 80 82  Resp: 10 16  Temp: 36.7 C 36.6 C  SpO2: 93% 99%    Last Pain:  Vitals:   08/23/20 1050  TempSrc: Temporal  PainSc: 0-No pain                 Corinda Gubler

## 2020-08-23 NOTE — H&P (Signed)
History of Present Illness: 08/23/2020 Jerome [name] is still having R leg pain.  He has failed conservative management.  06/22/2020  Jerome [name] returns to see me with continued pain in his right buttock. He also has some tailbone pain. Since I last saw him, he has had some moderation of his symptoms but they have never resolved. He has undergone a series of epidural steroid injections and has been doing physical therapy for the past 5 weeks. He denies weakness. Standing and walking make his pain worse. Nothing is really helped. He is unable to do some of his normal activities such as playing with his children.  05/07/2018 Until last visit, he has done physical therapy as well as an injection. He is feeling much better and currently has no pain. He is very happy with his improvement.  04/09/2018 Mr. Jerome [name] is here today with a chief complaint of low back pain and right buttock burning and occasional burning left buttock burning & pain. He was having right leg pain from his knee to his foot and left lateral calf pain, but this has mostly resolved.  This is his third episode of pain that is in a similar fashion over the past 2 years. This 1 was started by a sneezing episode around December 10.  He has had difficulty on and off for about 2 years. He feels like he is sitting on a heating pad or like a knife. It can be up to 10 out of 10, and is made worse by sitting or standing. It is made better by laying down. He has significant stiffness in the mornings and after he rides in the car for more than 30 minutes. He denies weakness or bowel bladder dysfunction.  Currently, he is feeling much better. He went to the emergency department on Christmas day and severe pain and was put on a steroid taper. Since he was placed on this, he has had substantial improvement in his pain. He feels some tightness in the right side of his back, but is otherwise relatively asymptomatic at this point.  Conservative  measures: chiropractor Physical therapy: has not tried Multimodal medical therapy including regular antiinflammatories: norco, meloxicam, prednisone, metaxalone, norflex, ibuprofen, tramadol, tizanidine Injections: has not tried epidural steroid injections  Past Surgery: denies  Jerome [name] has no symptoms of cervical myelopathy.  The symptoms are causing a significant impact on the patient's life.   Review of Systems:  A 10 point review of systems is negative, except for the pertinent positives and negatives detailed in the HPI.  Past Medical History: Past Medical History:  Diagnosis Date  . DDD (degenerative disc disease), lumbar 06/13/2017  . Hypertension  . Stroke (CMS-HCC)   Past Surgical History: Past Surgical History:  Procedure Laterality Date  . pneumothorax with chest tube 2001 2001  Spontaneous  . REPAIR UMBILICAL HERNIA N/A 08/22/2015  Procedure: REPAIR UMBILICAL HERNIA, AGE 44 YEARS OR OLDER; REDUCIBLE; POSSIBLE MESH PLACEMENT; Surgeon: Satira Anis, MD; Location: [facility] OR; Service: General Surgery; Laterality: N/A; Umbilical Hernia  . TONSILLECTOMY    Allergies  Allergen Reactions  . Atorvastatin     Other reaction(s): Muscle Pain Elevated CPK  . Simvastatin Rash    Elevated CPK Other reaction(s): Muscle Pain Elevated CPK    Current Facility-Administered Medications for the 08/23/20 encounter [facility] Encounter)  Medication  . triamcinolone acetonide (KENALOG) 10 MG/ML injection 10 mg   Current Meds  Medication Sig  . acetaminophen (TYLENOL) 500 MG tablet Take 1,000 mg  by mouth every 8 (eight) hours as needed for moderate pain.  Jerome [name] ezetimibe (ZETIA) 10 MG tablet Take 10 mg by mouth daily.  Jerome [name] ibuprofen (ADVIL) 200 MG tablet Take 800 mg by mouth every 8 (eight) hours as needed for moderate pain.  Jerome [name] lisinopril-hydrochlorothiazide (ZESTORETIC) 20-25 MG tablet Take 1 tablet by mouth daily.  . metoprolol succinate (TOPROL-XL) 100 MG 24 hr tablet Take 100  mg by mouth daily. At lunchtime  . sildenafil (REVATIO) 20 MG tablet Take 20 mg by mouth daily.     Social History: Social History   Tobacco Use  . Smoking status: Former Smoker  Packs/day: 1.00  Years: 8.00  Pack years: 8.00  Types: Cigarettes  Quit date: 03/18/1997  Years since quitting: 23.2  . Smokeless tobacco: Current User  Types: Chew  Vaping Use  . Vaping Use: Never used  Substance Use Topics  . Alcohol use: Yes  Alcohol/week: 0.0 standard drinks  Comment: occasional beer  . Drug use: No   Family Medical History: Family History  Problem Relation Age of Onset  . Coronary Artery Disease (Blocked arteries around heart) Mother 65  CABG  . High blood pressure (Hypertension) Mother  . Coronary Artery Disease (Blocked arteries around heart) Father 63  Stents  . High blood pressure (Hypertension) Father  . Asthma Son  . Asthma Brother  . Cancer Neg Hx   Physical Examination:  Vitals:   08/23/20 0600  BP: (!) 147/98  Pulse: 95  Resp: 20  Temp: (!) 96.9 F (36.1 C)  SpO2: 98%   Heart sounds normal no MRG. Chest Clear to Auscultation Bilaterally.   General: Patient is well developed, well nourished, calm, collected, and in no apparent distress. Attention to examination is appropriate.  Psychiatric: Patient is non-anxious.  Head: Pupils equal, round, and reactive to light.  ENT: Oral mucosa appears well hydrated.  Neck: Supple. Full range of motion.  Respiratory: Patient is breathing without any difficulty.  Extremities: No edema.  Vascular: Palpable dorsal pedal pulses.  Skin: On exposed skin, there are no abnormal skin lesions.  NEUROLOGICAL:   Awake, alert, oriented to person, place, and time. Speech is clear and fluent. Fund of knowledge is appropriate.   Cranial Nerves: Pupils equal round and reactive to light. Facial tone is symmetric. Facial sensation is symmetric. Shoulder shrug is symmetric. Tongue protrusion is midline. There is no  pronator drift.  ROM of spine: full. Palpation of spine: non tender.   Strength: Side Biceps Triceps Deltoid Interossei Grip Wrist Ext. Wrist Flex.  R 5 5 5 5 5 5 5   L 5 5 5 5 5 5 5    Side Iliopsoas Quads Hamstring PF DF EHL  R 5 5 5 5 5 5   L 5 5 5 5 5 5    Reflexes are 1+ and symmetric at the biceps, triceps, brachioradialis, patella and achilles. Hoffman's is absent. Clonus is not present. Toes are down-going.  Bilateral upper and lower extremity sensation is intact to light touch.  Gait is normal. No difficulty with tandem gait.  Rapid alternating movements are normal.   Medical Decision Making  Imaging: MRI L spine 03/22/2018 IMPRESSION: L3-4: Small central disc protrusion indents the thecal sac and causes mild stenosis of the lateral recesses. Definite nerve compression not established at this level.  L4-5: Large disc herniation more prominent towards the right with extruded disc material migrated caudally. Mild facet and ligamentous hypertrophy. Spinal stenosis at this level that could cause neural compression on either  or both sides, particularly likely on the right because of the extruded disc material.  L5-S1: Asymmetric osteophytes and disc protrusion more prominent towards the left. Asymmetric facet arthropathy worse on the left. Left foraminal and subarticular lateral recess stenosis that could cause neural compression. Lesser foraminal narrowing on the right.  Electronically Signed By: Paulina Fusi M.D. 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   Inferiorly migrated right L4-5 paracentral extrusion abutting the  descending L5 nerve root. Mild to moderate spinal canal/neural  foraminal narrowing at this level.   Mild left L2-3 and bilateral L3-4 neural foraminal narrowing.   L5-S1 central and left subarticular protrusions with moderate left  and mild right neural foraminal  narrowing.   Electronically Signed  By: Stana Bunting M.D.  On: 05/08/2020 08:36  I have personally reviewed the images and agree with the above interpretation.  Assessment and Plan: Mr. Tomaro is a pleasant 45 y.o. 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   He has had some continued symptoms over the past 2 years.   He has failed conservative management, and will proceed with microdiscectomy at L4-5.  Venetia Night MD, [facility] Department of Neurosurgery

## 2020-08-23 NOTE — Anesthesia Procedure Notes (Signed)
Procedure Name: Intubation Date/Time: 08/23/2020 7:30 AM Performed by: Reece Agar, CRNA Pre-anesthesia Checklist: Patient identified, Emergency Drugs available, Suction available and Patient being monitored Patient Re-evaluated:Patient Re-evaluated prior to induction Oxygen Delivery Method: Circle system utilized Preoxygenation: Pre-oxygenation with 100% oxygen Induction Type: IV induction Ventilation: Mask ventilation without difficulty Laryngoscope Size: McGraph and 4 Grade View: Grade I Tube type: Oral Tube size: 7.5 mm Number of attempts: 1 Airway Equipment and Method: Stylet and Oral airway Placement Confirmation: ETT inserted through vocal cords under direct vision,  positive ETCO2 and breath sounds checked- equal and bilateral Tube secured with: Tape Dental Injury: Teeth and Oropharynx as per pre-operative assessment

## 2020-08-23 NOTE — Op Note (Signed)
Indications: Mr. Knoth is a 45 yo male who suffered from lumbar radiculopathy.  He failed conservative management and opted for surgical intervention.  Findings: disc herniation L4-5, scarring around the nerve root  Preoperative Diagnosis: Lumbar radiculopathy Postoperative Diagnosis: same   EBL: 50 ml IVF: 1000 ml Drains: none Disposition: Extubated and Stable to PACU Complications: none  No foley catheter was placed.   Preoperative Note:   Risks of surgery discussed include: infection, bleeding, stroke, coma, death, paralysis, CSF leak, nerve/spinal cord injury, numbness, tingling, weakness, complex regional pain syndrome, recurrent stenosis and/or disc herniation, vascular injury, development of instability, neck/back pain, need for further surgery, persistent symptoms, development of deformity, and the risks of anesthesia. The patient understood these risks and agreed to proceed.  Operative Note:   1) Right L4/5 microdiscectomy  The patient was then brought from the preoperative center with intravenous access established.  The patient underwent general anesthesia and endotracheal tube intubation, and was then rotated on the [place] rail top where all pressure points were appropriately padded.  The skin was then thoroughly cleansed.  Perioperative antibiotic prophylaxis was administered.  Sterile prep and drapes were then applied and a timeout was then observed.  C-arm was brought into the field under sterile conditions, and the L4-5 disc space identified and marked with an incision in the midline.  Once this was complete a 5 cm incision was opened with the use of a #10 blade knife.  The Metrx tubes were sequentially advanced under lateral fluoroscopy until a 18 x 80 mm Metrx tube was placed over the facet and lamina and secured to the bed.    The microscope was then sterilely brought into the field and muscle creep was hemostased with a bipolar and resected with a pituitary  rongeur.  A Bovie extender was then used to expose the spinous process and lamina.  Careful attention was placed to not violate the facet capsule. A 3 mm matchstick drill bit was then used to make a hemi-laminotomy trough until the ligamentum flavum was exposed.  This was extended to the base of the spinous process.  Once this was complete and the underlying ligamentum flavum was visualized this was dissected with an up angle curette and resected with a #2 and #3 mm biting Kerrison.  The laminotomy opening was also expanded in similar fashion and hemostasis was obtained with Surgifoam and a patty as well as bone wax.  The rostral aspect of the caudal level of the lamina was also resected with a #2 biting Kerrison effort to further enhance exposure.  Once the underlying dura was visualized a Penfield 4 was then used to dissect and expose the traversing nerve root.  Once this was identified a nerve root retractor suction was used to mobilize this medially.  The venous plexus was hemostased with Surgifoam and light bipolar use.  A small Penfield was then used to make a small annulotomy within the disc space and disc space contents were noted to come through the annulus.  Substantial scarring around the nerve root was noted, with the disc herniation being significantly adhered to the nerve.   The disc herniation was identified and dissected free using a balltip probe. The pituitary rongeur was used to remove the extruded disc fragments. Once the thecal sac and nerve root were noted to be relaxed and under less tension the ball-tipped feeler was passed along the foramen distally to to ensure no residual compression was noted.    Depo-Medrol was placed along the  nerve root.  The area was irrigated. The tube system was then removed under microscopic visualization and hemostasis was obtained with a bipolar.    The fascial layer was reapproximated with the use of a 0- Vicryl suture.  Subcutaneous tissue layer was  reapproximated using 2-0 Vicryl suture.  3-0 monocryl was used on the skin. The skin was then cleansed and Dermabond was used to close the skin opening.  Patient was then rotated back to the preoperative bed awakened from anesthesia and taken to recovery all counts are correct in this case.   I performed the entire procedure with the assistance of Anabel Halon PA as an Designer, television/film set.  Venetia Night MD

## 2020-08-23 NOTE — Anesthesia Preprocedure Evaluation (Signed)
Anesthesia Evaluation  Patient identified by MRN, date of birth, ID band Patient awake    Reviewed: Allergy & Precautions, NPO status , Patient's Chart, lab work & pertinent test results  History of Anesthesia Complications Negative for: history of anesthetic complications  Airway Mallampati: II  TM Distance: >3 FB Neck ROM: Full    Dental no notable dental hx. (+) Teeth Intact   Pulmonary sleep apnea , neg COPD, Patient abstained from smoking.Not current smoker, former smoker,  Chews tobacco   Pulmonary exam normal breath sounds clear to auscultation       Cardiovascular Exercise Tolerance: Good METShypertension, (-) CAD and (-) Past MI (-) dysrhythmias  Rhythm:Regular Rate:Normal - Systolic murmurs    Neuro/Psych  Headaches,  Neuromuscular disease negative psych ROS   GI/Hepatic neg GERD  ,(+)     (-) substance abuse  ,   Endo/Other  neg diabetes  Renal/GU negative Renal ROS     Musculoskeletal   Abdominal   Peds  Hematology   Anesthesia Other Findings Past Medical History: No date: Arthritis No date: COVID-19 No date: Family history of adverse reaction to anesthesia     Comment:  mother has open heart surgery had swelling , hard to               wake up No date: History of kidney stones No date: Hyperlipidemia No date: Hypertension 2001: Pneumothorax, spontaneous, tension No date: PVC (premature ventricular contraction) No date: Stroke [facility])     Comment:  questionable  Reproductive/Obstetrics                             Anesthesia Physical Anesthesia Plan  ASA: II  Anesthesia Plan: General   Post-op Pain Management:    Induction: Intravenous  PONV Risk Score and Plan: 2 and Ondansetron, Dexamethasone and Midazolam  Airway Management Planned: Oral ETT  Additional Equipment: None  Intra-op Plan:   Post-operative Plan: Extubation in OR  Informed Consent: I have  reviewed the patients History and Physical, chart, labs and discussed the procedure including the risks, benefits and alternatives for the proposed anesthesia with the patient or authorized representative who has indicated his/her understanding and acceptance.     Dental advisory given  Plan Discussed with: CRNA and Surgeon  Anesthesia Plan Comments: (Discussed risks of anesthesia with patient, including PONV, sore throat, lip/dental damage. Rare risks discussed as well, such as cardiorespiratory and neurological sequelae. Patient understands.)        Anesthesia Quick Evaluation

## 2020-08-23 NOTE — Transfer of Care (Signed)
Immediate Anesthesia Transfer of Care Note  Patient: Jerome [name]  Procedure(s) Performed: RIGHT L4-5 MICRODISCECTOMY (Right )  Patient Location: PACU  Anesthesia Type:General  Level of Consciousness: awake, drowsy and patient cooperative  Airway & Oxygen Therapy: Patient Spontanous Breathing and Patient connected to face mask oxygen  Post-op Assessment: Report given to RN and Post -op Vital signs reviewed and stable  Post vital signs: Reviewed and stable  Last Vitals:  Vitals Value Taken Time  BP 148/107 08/23/20 1000  Temp 36.3 C 08/23/20 1000  Pulse 87 08/23/20 1004  Resp 15 08/23/20 1004  SpO2 100 % 08/23/20 1004  Vitals shown include unvalidated device data.  Last Pain:  Vitals:   08/23/20 1000  TempSrc:   PainSc: Asleep         Complications: No complications documented.

## 2020-08-24 ENCOUNTER — Encounter: Payer: Self-pay | Admitting: Neurosurgery

## 2020-08-28 ENCOUNTER — Encounter: Payer: Self-pay | Admitting: Neurosurgery

## 2020-08-28 MED ORDER — HEMOSTATIC AGENTS (NO CHARGE) OPTIME
TOPICAL | Status: DC | PRN
  Administered 2020-08-23: 1 via TOPICAL

## 2020-09-16 IMAGING — DX PORTABLE CHEST - 1 VIEW
1 series · 1 of 1 positions shown · non-contrast
Comparison: None.

CLINICAL DATA: Chest palpitations and sensation of heart flutter
for 3 days.

EXAM:
PORTABLE CHEST 1 VIEW

[chest ap]
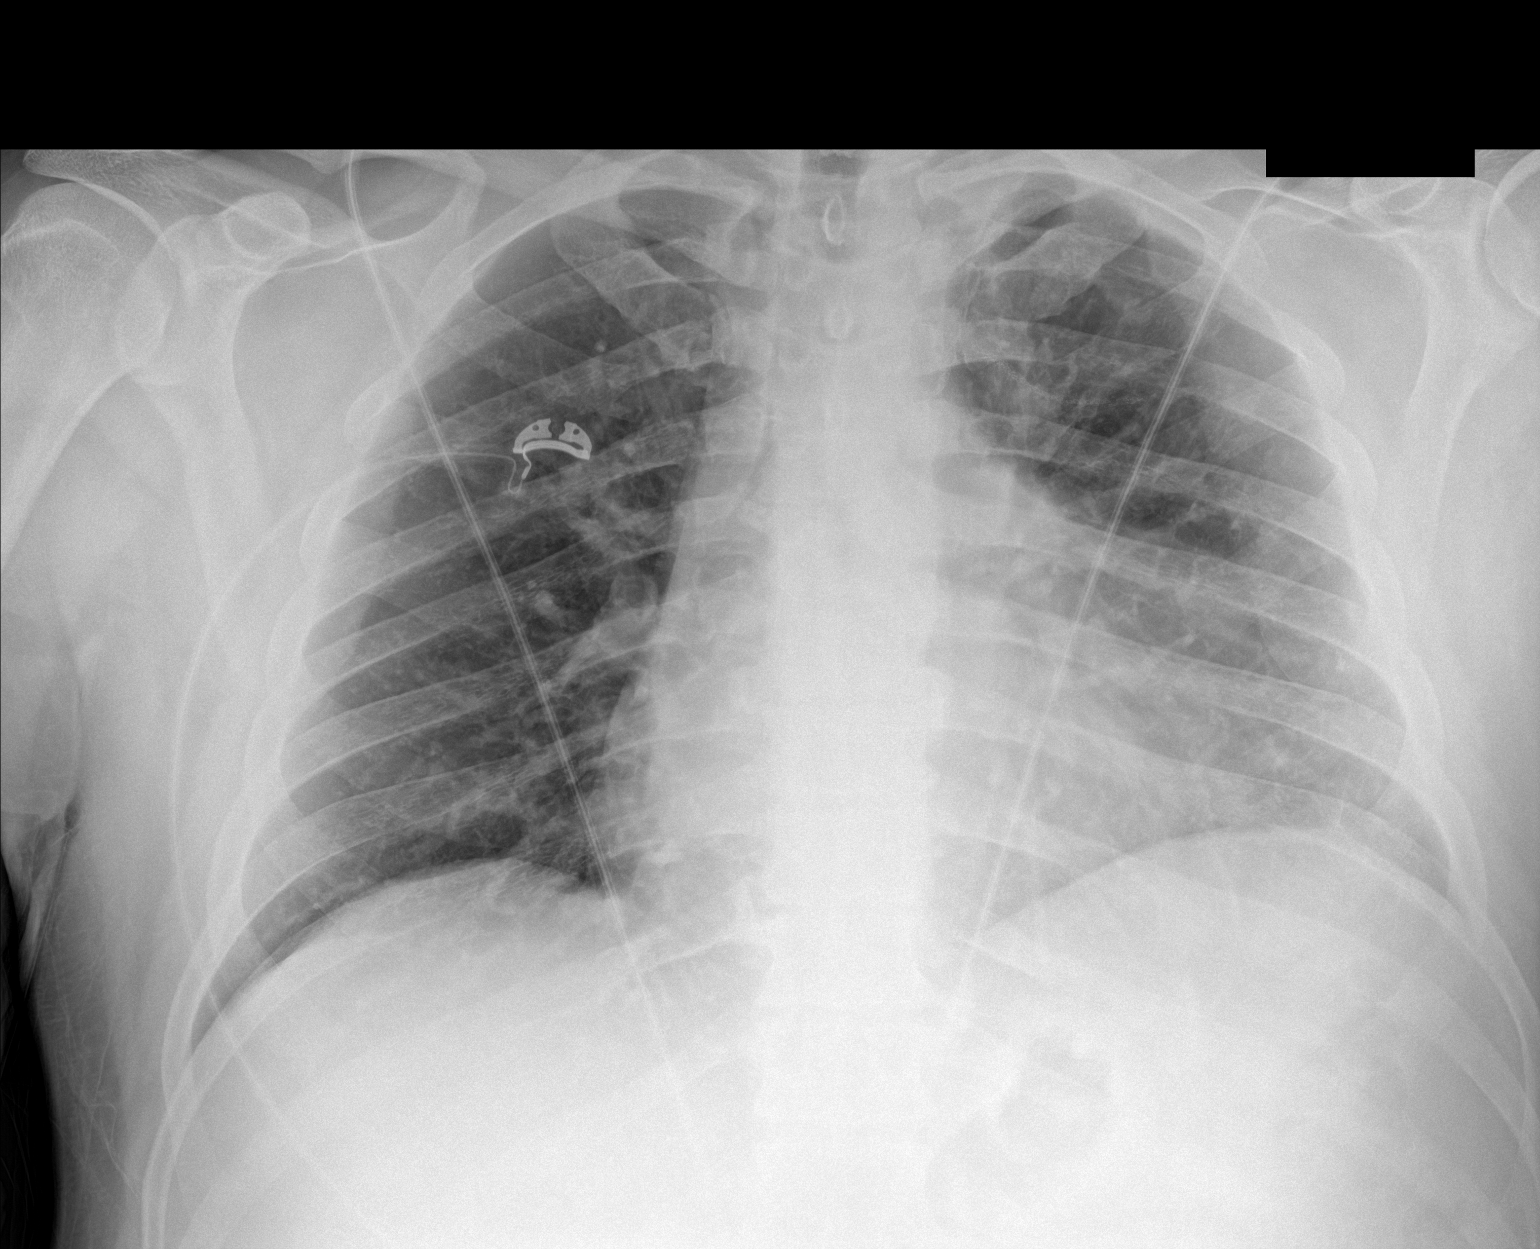

[1 of 1 positions shown; findings below may reference images not displayed]

FINDINGS: Lungs are clear. Heart size is normal. No pneumothorax or pleural
fluid. No acute or focal bony abnormality.
IMPRESSION: Negative chest.

## 2021-03-04 ENCOUNTER — Other Ambulatory Visit: Payer: Self-pay

## 2021-03-04 ENCOUNTER — Ambulatory Visit
Admission: EM | Admit: 2021-03-04 | Discharge: 2021-03-04 | Disposition: A | Discharge status: Home / Self Care | Payer: BC Managed Care – PPO | Attending: Emergency Medicine | Admitting: Emergency Medicine

## 2021-03-04 ENCOUNTER — Encounter: Payer: Self-pay | Admitting: Emergency Medicine

## 2021-03-04 DIAGNOSIS — J069 Acute upper respiratory infection, unspecified: Secondary | ICD-10-CM

## 2021-03-04 LAB — POCT INFLUENZA A/B
Influenza A, POC: NEGATIVE
Influenza B, POC: NEGATIVE

## 2021-03-04 MED ORDER — BENZONATATE 100 MG PO CAPS: 100.0000 mg | ORAL_CAPSULE | Freq: Three times a day (TID) | ORAL | 0 refills | Status: AC | PRN

## 2021-03-04 NOTE — ED Provider Notes (Signed)
[name]    CSN: [id number] Arrival date & time: 03/04/21  1240      History   Chief Complaint Chief Complaint  Patient presents with   Cough   Nasal Congestion   Generalized Body Aches    HPI Jerome [name] is a 45 y.o. male.  Patient presents with 2 to 3-day history of congestion and cough.  He also reports chills and possible fever.  He did not take his temperature.  He denies ear pain, sore throat, rash, shortness of breath, vomiting, diarrhea, or other symptoms.  Treatment at home with OTC cough medication.  Patient was seen by his PCP on 02/20/2021; diagnosed with cough; symptomatic treatment.  His medical history includes hypertension, stroke, chronic low back pain, remote history of pneumothorax.   The history is provided by the patient and medical records.   Past Medical History:  Diagnosis Date   Arthritis    COVID-19    Family history of adverse reaction to anesthesia    mother has open heart surgery had swelling , hard to wake up   History of kidney stones    Hyperlipidemia    Hypertension    Pneumothorax, spontaneous, tension 2001   PVC (premature ventricular contraction)    Stroke [facility])    questionable    Patient Active Problem List   Diagnosis Date Noted   Lumbar stenosis without neurogenic claudication 02/03/2020   Chronic right-sided low back pain with right-sided sciatica 09/02/2019   Abnormal ECG 10/30/2018   Heart palpitations 10/30/2018   SOB (shortness of breath) on exertion 10/30/2018   DDD (degenerative disc disease), lumbar 06/13/2017   Obesity (BMI 30.0-34.9) 06/13/2017   Headache disorder 03/23/2015   Cerebral infarction (HCC) 11/30/2012   Tests ordered 11/30/2012   Essential hypertension 07/06/2012   Hypercholesterolemia 07/06/2012   Snuff user 07/06/2012    Past Surgical History:  Procedure Laterality Date   HERNIA REPAIR     umbilical hernia   LUMBAR LAMINECTOMY/DECOMPRESSION MICRODISCECTOMY Right 08/23/2020    Procedure: RIGHT L4-5 MICRODISCECTOMY;  Surgeon: Venetia Night, MD;  Location: [facility] ORS;  Service: Neurosurgery;  Laterality: Right;   Pneumothorax- chest tube     TONSILLECTOMY     uvula       Home Medications    Prior to Admission medications   Medication Sig Start Date End Date Taking? Authorizing Provider  benzonatate (TESSALON) 100 MG capsule Take 1 capsule (100 mg total) by mouth 3 (three) times daily as needed for cough. 03/04/21  Yes Mickie Bail, NP  acetaminophen (TYLENOL) 500 MG tablet Take 1,000 mg by mouth every 8 (eight) hours as needed for moderate pain.    [provider]  ezetimibe (ZETIA) 10 MG tablet Take 10 mg by mouth daily. 10/28/17   [provider]  ibuprofen (ADVIL) 200 MG tablet Take 800 mg by mouth every 8 (eight) hours as needed for moderate pain.    [provider]  lisinopril-hydrochlorothiazide (ZESTORETIC) 20-25 MG tablet Take 1 tablet by mouth daily. 02/17/20   [provider]  methocarbamol (ROBAXIN) 500 MG tablet Take 1 tablet (500 mg total) by mouth every 6 (six) hours as needed for muscle spasms. 08/23/20   Venetia Night, MD  metoprolol succinate (TOPROL-XL) 100 MG 24 hr tablet Take 100 mg by mouth daily. At lunchtime    [provider]  sildenafil (REVATIO) 20 MG tablet Take 20 mg by mouth daily.    [provider]    Family History Family  History  Problem Relation Age of Onset   Hypertension Mother    Coronary artery disease Mother        CABG at age 48   Hypertension Father    CAD Father 53   Asthma Brother    Asthma Son     Social History Social History   Tobacco Use   Smoking status: Never   Smokeless tobacco: Current    Types: Chew  Vaping Use   Vaping Use: Never used  Substance Use Topics   Alcohol use: Yes    Alcohol/week: 0.0 standard drinks    Comment: 3 x month   Drug use: Never     Allergies   Atorvastatin and Simvastatin   Review of Systems Review of  Systems  Constitutional:  Negative for chills and fever.  HENT:  Positive for congestion. Negative for ear pain and sore throat.   Respiratory:  Positive for cough. Negative for shortness of breath.   Cardiovascular:  Negative for chest pain and palpitations.  Gastrointestinal:  Negative for diarrhea and vomiting.  Skin:  Negative for color change and rash.  All other systems reviewed and are negative.   Physical Exam Triage Vital Signs ED Triage Vitals  Enc Vitals Group     BP 03/04/21 1343 (!) 151/88     Pulse Rate 03/04/21 1343 88     Resp 03/04/21 1343 18     Temp 03/04/21 1343 98.2 F (36.8 C)     Temp src --      SpO2 03/04/21 1343 96 %     Weight --      Height --      Head Circumference --      Peak Flow --      Pain Score 03/04/21 1351 3     Pain Loc --      Pain Edu? --      Excl. in GC? --    No data found.  Updated Vital Signs BP (!) 151/88 (BP Location: Left Arm)    Pulse 88    Temp 98.2 F (36.8 C)    Resp 18    SpO2 96%   Visual Acuity Right Eye Distance:   Left Eye Distance:   Bilateral Distance:    Right Eye Near:   Left Eye Near:    Bilateral Near:     Physical Exam Vitals and nursing note reviewed.  Constitutional:      General: He is not in acute distress.    Appearance: He is well-developed. He is not ill-appearing.  HENT:     Right Ear: Tympanic membrane normal.     Left Ear: Tympanic membrane normal.     Nose: Nose normal.     Mouth/Throat:     Mouth: Mucous membranes are moist.     Pharynx: Oropharynx is clear.  Cardiovascular:     Rate and Rhythm: Normal rate and regular rhythm.     Heart sounds: Normal heart sounds.  Pulmonary:     Effort: Pulmonary effort is normal. No respiratory distress.     Breath sounds: Normal breath sounds.  Musculoskeletal:     Cervical back: Neck supple.  Skin:    General: Skin is warm and dry.  Neurological:     Mental Status: He is alert.  Psychiatric:        Mood and Affect: Mood normal.         Behavior: Behavior normal.     UC Treatments / Results  Labs (  all labs ordered are listed, but only abnormal results are displayed) Labs Reviewed  POCT INFLUENZA A/B - Normal    EKG   Radiology No results found.  Procedures Procedures (including critical care time)  Medications Ordered in UC Medications - No data to display  Initial Impression / Assessment and Plan / UC Course  I have reviewed the triage vital signs and the nursing notes.  Pertinent labs & imaging results that were available during my care of the patient were reviewed by me and considered in my medical decision making (see chart for details).   Viral URI with cough.  Rapid flu negative.  Patient declines COVID test.  Treating cough with Tessalon Perles.  Instructed patient to follow-up with his PCP if his symptoms are not improving.  He agrees to plan of care.   Final Clinical Impressions(s) / UC Diagnoses   Final diagnoses:  Viral URI with cough     Discharge Instructions      Your flu test is negative.  Take the [facility] as needed for cough.  Follow up with your primary care provider if your symptoms are not improving.        ED Prescriptions     Medication Sig Dispense Auth. Provider   benzonatate (TESSALON) 100 MG capsule Take 1 capsule (100 mg total) by mouth 3 (three) times daily as needed for cough. 21 capsule Mickie Bail, NP      PDMP not reviewed this encounter.   Mickie Bail, NP 03/04/21 [phone]

## 2021-03-04 NOTE — ED Triage Notes (Signed)
Pt here with URI sx and unsure whether he has a fever x 2 days.

## 2021-03-04 NOTE — Discharge Instructions (Addendum)
Your flu test is negative.    Take the Tessalon Perles as needed for cough.  Follow up with your primary care provider if your symptoms are not improving.     

## 2021-11-13 ENCOUNTER — Encounter: Payer: Self-pay | Admitting: Emergency Medicine

## 2021-11-13 ENCOUNTER — Ambulatory Visit
Admission: EM | Admit: 2021-11-13 | Discharge: 2021-11-13 | Disposition: A | Discharge status: Home / Self Care | Payer: BC Managed Care – PPO | Attending: Family Medicine | Admitting: Family Medicine

## 2021-11-13 ENCOUNTER — Other Ambulatory Visit: Payer: Self-pay

## 2021-11-13 DIAGNOSIS — L02214 Cutaneous abscess of groin: Secondary | ICD-10-CM

## 2021-11-13 MED ORDER — MUPIROCIN 2 % EX OINT: 1.0000 | TOPICAL_OINTMENT | Freq: Two times a day (BID) | CUTANEOUS | 0 refills | Status: AC

## 2021-11-13 MED ORDER — DOXYCYCLINE HYCLATE 100 MG PO CAPS: 100.0000 mg | ORAL_CAPSULE | Freq: Two times a day (BID) | ORAL | 0 refills | Status: AC

## 2021-11-13 NOTE — Discharge Instructions (Signed)
-  You look to have a minor skin infection, possible infected/ingrown hair. - I have sent antibiotics to pharmacy for a few days as well as topical antibiotic ointment.  You can also apply hydrocortisone cream topically to help with swelling. - Use warm compresses to the area. - If you develop increased redness, swelling, pain or fever you should return for reevaluation.

## 2021-11-13 NOTE — ED Provider Notes (Signed)
[facility]    CSN: [id number] Arrival date & time: 11/13/21  1914      History   Chief Complaint Chief Complaint  Patient presents with   Abscess    HPI Jerome [name] is a 46 y.o. male for an erythematous slightly tender area of swelling and rash of the left groin area that he noticed today.  He says he does not know how long it has been there for.  He says that he does feel a bit achy is concerned about potential tick bite but did not see any ticks.  He has not treated condition in any way.  States he came here when he noticed it.  He has not had any fevers in the area has not drained or black.  No history of skin infections.  HPI  Past Medical History:  Diagnosis Date   Arthritis    COVID-19    Family history of adverse reaction to anesthesia    mother has open heart surgery had swelling , hard to wake up   History of kidney stones    Hyperlipidemia    Hypertension    Pneumothorax, spontaneous, tension 2001   PVC (premature ventricular contraction)    Stroke [facility])    questionable    Patient Active Problem List   Diagnosis Date Noted   Lumbar stenosis without neurogenic claudication 02/03/2020   Chronic right-sided low back pain with right-sided sciatica 09/02/2019   Abnormal ECG 10/30/2018   Heart palpitations 10/30/2018   SOB (shortness of breath) on exertion 10/30/2018   DDD (degenerative disc disease), lumbar 06/13/2017   Obesity (BMI 30.0-34.9) 06/13/2017   Headache disorder 03/23/2015   Cerebral infarction (HCC) 11/30/2012   Tests ordered 11/30/2012   Essential hypertension 07/06/2012   Hypercholesterolemia 07/06/2012   Snuff user 07/06/2012    Past Surgical History:  Procedure Laterality Date   HERNIA REPAIR     umbilical hernia   LUMBAR LAMINECTOMY/DECOMPRESSION MICRODISCECTOMY Right 08/23/2020   Procedure: RIGHT L4-5 MICRODISCECTOMY;  Surgeon: Venetia Night, MD;  Location: [facility] ORS;  Service: Neurosurgery;  Laterality: Right;    Pneumothorax- chest tube     TONSILLECTOMY     uvula       Home Medications    Prior to Admission medications   Medication Sig Start Date End Date Taking? Authorizing Provider  acetaminophen (TYLENOL) 500 MG tablet Take 1,000 mg by mouth every 8 (eight) hours as needed for moderate pain.   Yes [provider]  doxycycline (VIBRAMYCIN) 100 MG capsule Take 1 capsule (100 mg total) by mouth 2 (two) times daily for 7 days. 11/13/21 11/20/21 Yes Shirlee Latch, PA-C  ezetimibe (ZETIA) 10 MG tablet Take 10 mg by mouth daily. 10/28/17  Yes [provider]  ibuprofen (ADVIL) 200 MG tablet Take 800 mg by mouth every 8 (eight) hours as needed for moderate pain.   Yes [provider]  lisinopril-hydrochlorothiazide (ZESTORETIC) 20-25 MG tablet Take 1 tablet by mouth daily. 02/17/20  Yes [provider]  metoprolol succinate (TOPROL-XL) 100 MG 24 hr tablet Take 100 mg by mouth daily. At lunchtime   Yes [provider]  mupirocin ointment (BACTROBAN) 2 % Apply 1 Application topically 2 (two) times daily for 7 days. 11/13/21 11/20/21 Yes Shirlee Latch, PA-C  benzonatate (TESSALON) 100 MG capsule Take 1 capsule (100 mg total) by mouth 3 (three) times daily as needed for cough. 03/04/21   Mickie Bail, NP  methocarbamol (ROBAXIN) 500 MG tablet Take  1 tablet (500 mg total) by mouth every 6 (six) hours as needed for muscle spasms. 08/23/20   Venetia Night, MD  REPATHA SURECLICK 140 MG/ML SOAJ Inject 1 Syringe into the skin every 14 (fourteen) days. 09/20/21   [provider]  sildenafil (REVATIO) 20 MG tablet Take 20 mg by mouth daily.    [provider]    Family History Family History  Problem Relation Age of Onset   Hypertension Mother    Coronary artery disease Mother        CABG at age 41   Hypertension Father    CAD Father 64   Asthma Brother    Asthma Son     Social History Social History   Tobacco Use   Smoking status: Never    Smokeless tobacco: Current    Types: Chew  Vaping Use   Vaping Use: Never used  Substance Use Topics   Alcohol use: Yes    Alcohol/week: 0.0 standard drinks of alcohol    Comment: 3 x month   Drug use: Never     Allergies   Atorvastatin and Simvastatin   Review of Systems Review of Systems  Constitutional:  Negative for fatigue and fever.  Musculoskeletal:  Positive for arthralgias.  Skin:  Positive for color change and rash.  Neurological:  Negative for weakness.     Physical Exam Triage Vital Signs ED Triage Vitals  Enc Vitals Group     BP 11/13/21 1938 131/84     Pulse Rate 11/13/21 1938 85     Resp 11/13/21 1938 16     Temp 11/13/21 1938 98.2 F (36.8 C)     Temp Source 11/13/21 1938 Oral     SpO2 11/13/21 1938 99 %     Weight 11/13/21 1937 268 lb 15.4 oz (122 kg)     Height 11/13/21 1937 6' (1.829 m)     Head Circumference --      Peak Flow --      Pain Score 11/13/21 1936 0     Pain Loc --      Pain Edu? --      Excl. in GC? --    No data found.  Updated Vital Signs BP 131/84 (BP Location: Left Arm)   Pulse 85   Temp 98.2 F (36.8 C) (Oral)   Resp 16   Ht 6' (1.829 m)   Wt 268 lb 15.4 oz (122 kg)   SpO2 99%   BMI 36.48 kg/m     Physical Exam Vitals and nursing note reviewed.  Constitutional:      General: He is not in acute distress.    Appearance: Normal appearance. He is well-developed. He is not ill-appearing.  HENT:     Head: Normocephalic and atraumatic.  Eyes:     General: No scleral icterus.    Conjunctiva/sclera: Conjunctivae normal.  Cardiovascular:     Rate and Rhythm: Normal rate and regular rhythm.  Pulmonary:     Effort: Pulmonary effort is normal. No respiratory distress.     Breath sounds: Normal breath sounds.  Musculoskeletal:     Cervical back: Neck supple.  Skin:    General: Skin is warm and dry.     Capillary Refill: Capillary refill takes less than 2 seconds.     Comments: Left groin: Small erythematous  papule/indurated region without fluctuance.  No erythema migrans type rashes.  Area is slightly tender.  There is no drainage or bleeding.  Neurological:  General: No focal deficit present.     Mental Status: He is alert. Mental status is at baseline.     Motor: No weakness.     Gait: Gait normal.  Psychiatric:        Mood and Affect: Mood normal.        Behavior: Behavior normal.      UC Treatments / Results  Labs (all labs ordered are listed, but only abnormal results are displayed) Labs Reviewed - No data to display  EKG   Radiology No results found.  Procedures Procedures (including critical care time)  Medications Ordered in UC Medications - No data to display  Initial Impression / Assessment and Plan / UC Course  I have reviewed the triage vital signs and the nursing notes.  Pertinent labs & imaging results that were available during my care of the patient were reviewed by me and considered in my medical decision making (see chart for details).   46 year old male presenting for small area of swelling, redness and discomfort of left groin that he noticed today.  Also reports some aches and is concerned about potential infection or tick bite.  On examination the area is consistent with a papule/indurated region, could be developing abscess versus insect bite.  We will treat at this time with doxycycline and topical mupirocin ointment.  Discussed wound care guidelines.  Reviewed return and ER precautions.   Final Clinical Impressions(s) / UC Diagnoses   Final diagnoses:  Abscess of left groin     Discharge Instructions      -You look to have a minor skin infection, possible infected/ingrown hair. - I have sent antibiotics to pharmacy for a few days as well as topical antibiotic ointment.  You can also apply hydrocortisone cream topically to help with swelling. - Use warm compresses to the area. - If you develop increased redness, swelling, pain or fever you  should return for reevaluation.     ED Prescriptions     Medication Sig Dispense Auth. Provider   doxycycline (VIBRAMYCIN) 100 MG capsule Take 1 capsule (100 mg total) by mouth 2 (two) times daily for 7 days. 14 capsule Laurene Footman B, PA-C   mupirocin ointment (BACTROBAN) 2 % Apply 1 Application topically 2 (two) times daily for 7 days. 22 g Danton Clap, PA-C      PDMP not reviewed this encounter.   Danton Clap, PA-C 11/16/21 [phone]

## 2021-11-13 NOTE — ED Triage Notes (Signed)
Pt has a lesion on his left groin area. It has a dark ring in the center and red area around it. Noticed it today. Pt unsure if it is a tick bite. He states it is sore. He states he has been having knee pain ans headache for the last 2 days. He

## 2022-02-13 IMAGING — US US SCROTUM W/ DOPPLER COMPLETE
1 series · 14 of 25 positions shown · non-contrast
Comparison: None.

CLINICAL DATA: Scrotal swelling.

EXAM:
SCROTAL ULTRASOUND
DOPPLER ULTRASOUND OF THE TESTICLES
TECHNIQUE: Complete ultrasound examination of the testicles, epididymis, and
other scrotal structures was performed. Color and spectral Doppler
ultrasound were also utilized to evaluate blood flow to the
testicles.

[Series 1: us scrotum w/doppler · 14 of 83 slices shown]
[im 1/83]
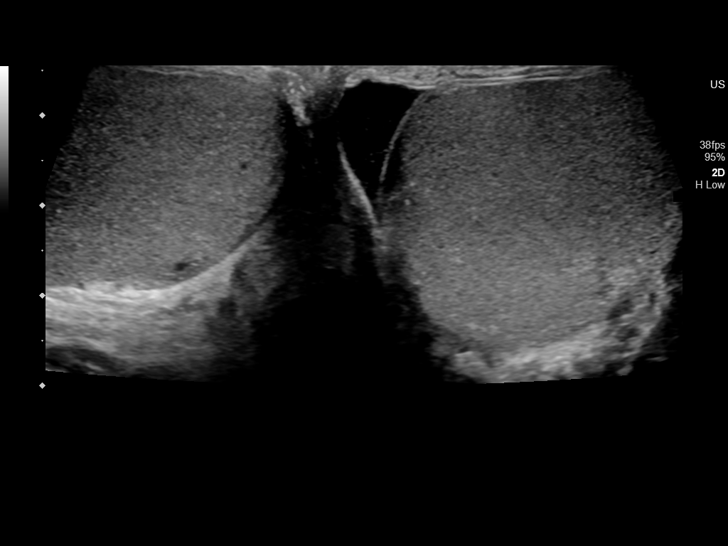
[im 7/83]
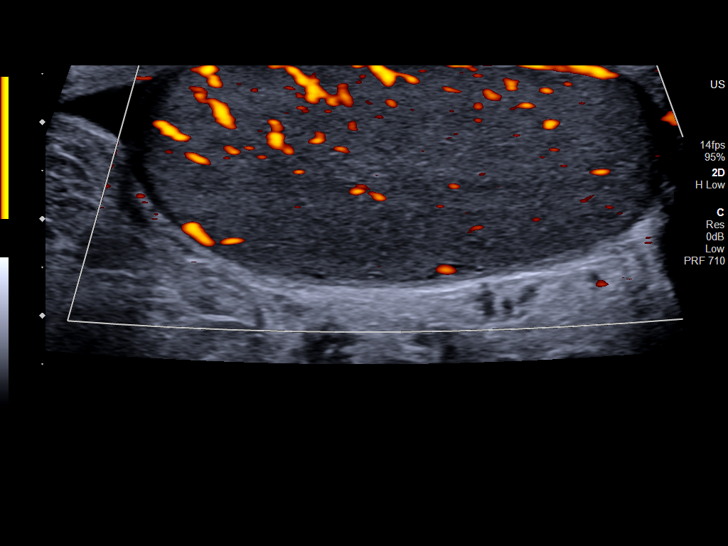
[im 14/83]
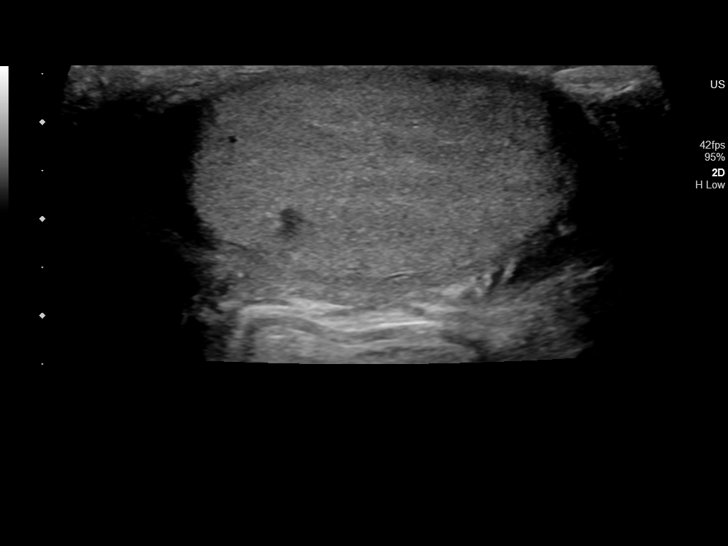
[im 21/83]
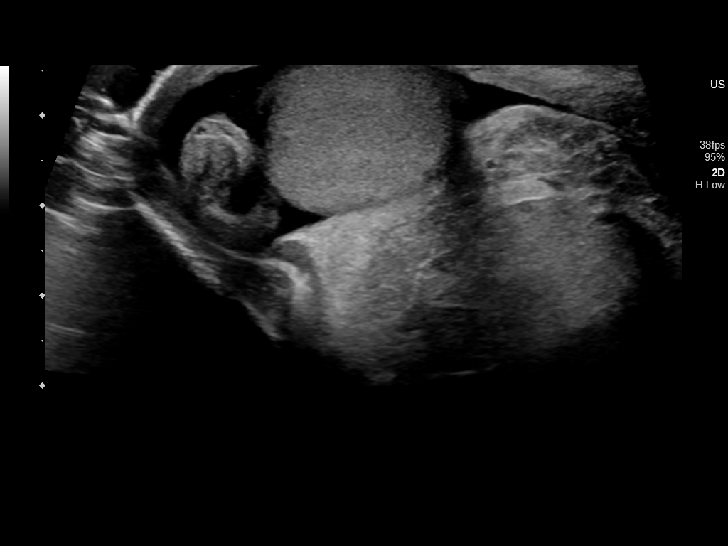
[im 28/83]
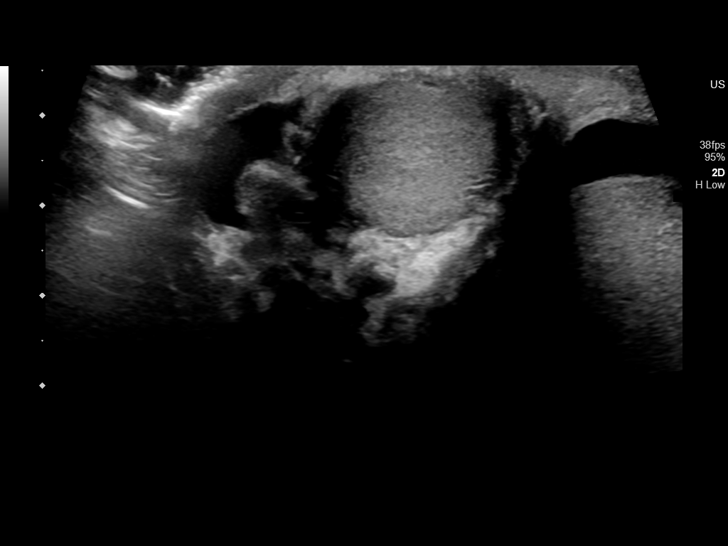
[im 31/83]
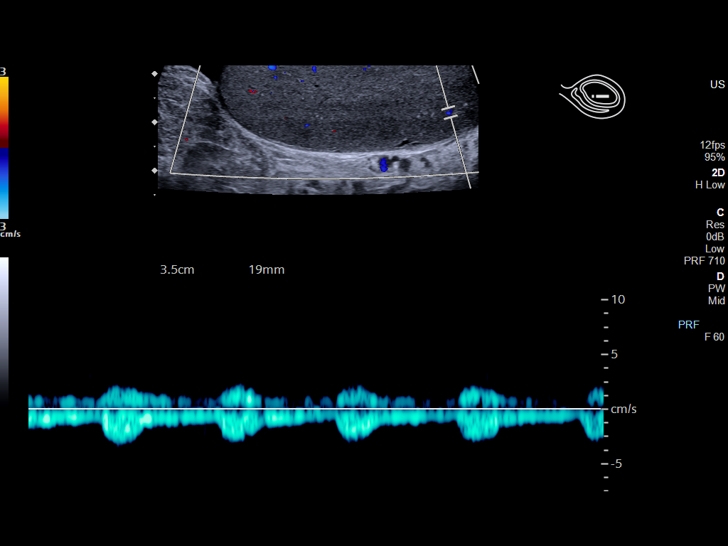
[im 38/83]
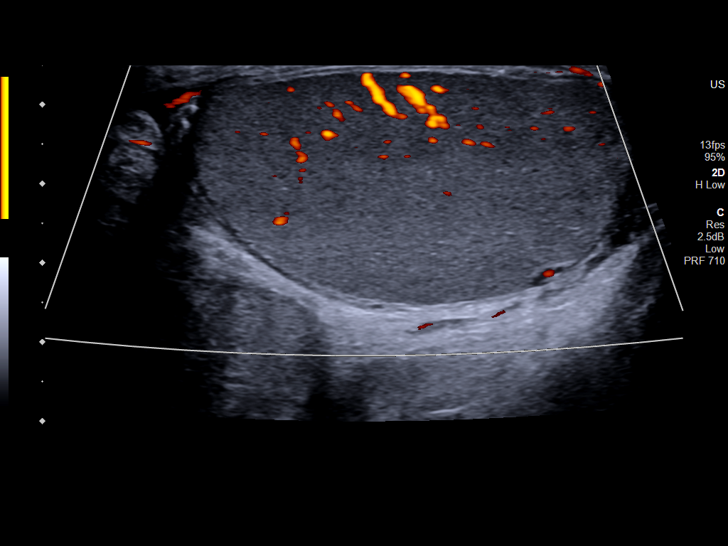
[im 45/83]
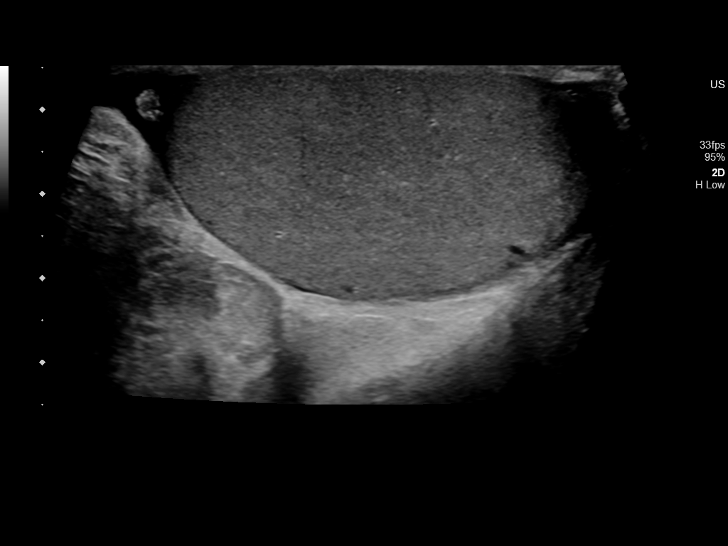
[im 52/83]
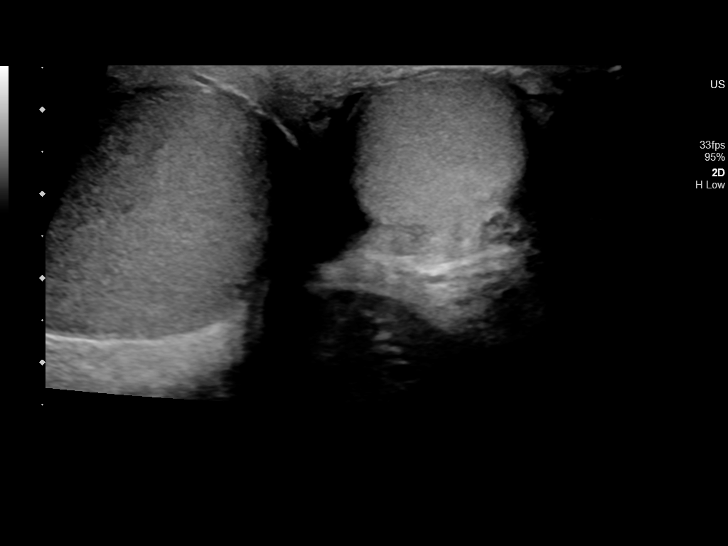
[im 55/83]
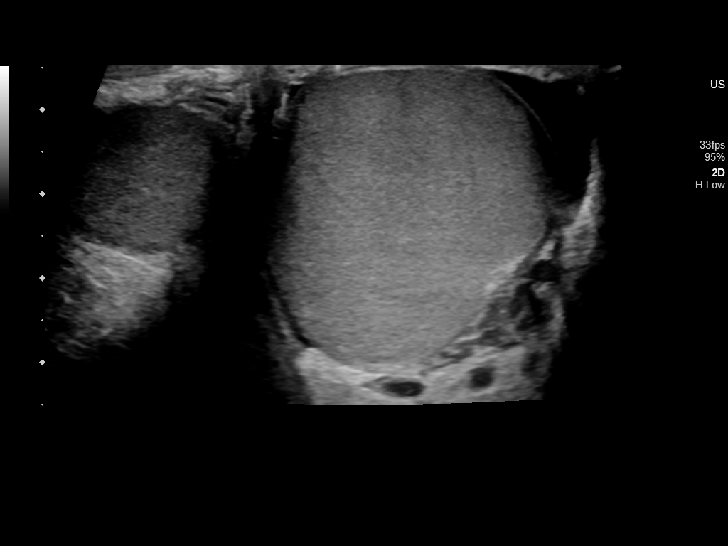
[im 62/83]
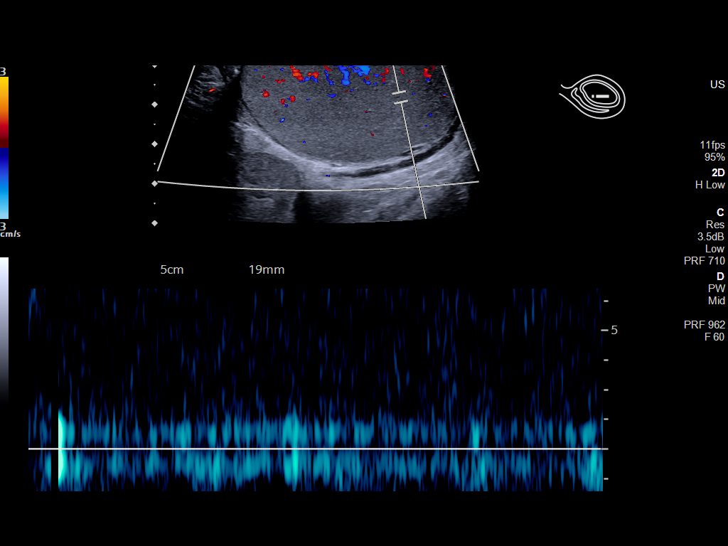
[im 69/83]
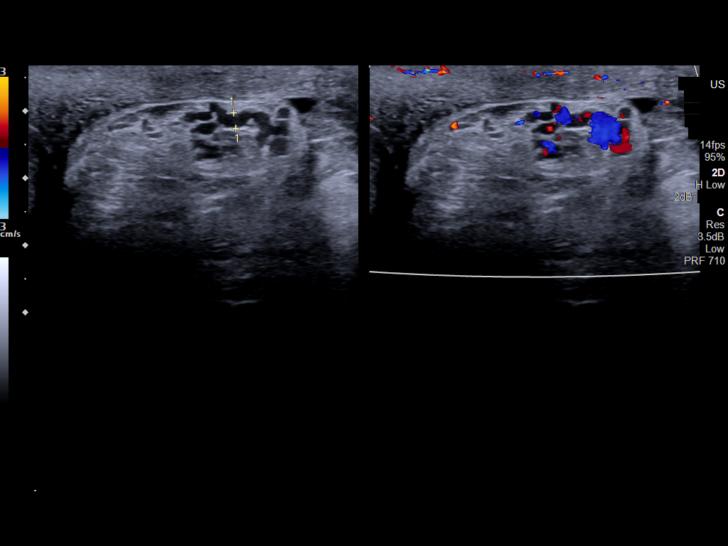
[im 76/83]
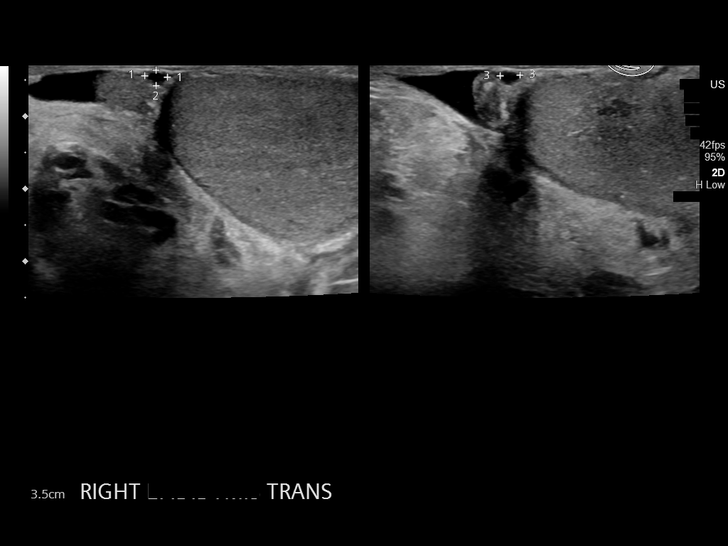
[im 83/83]
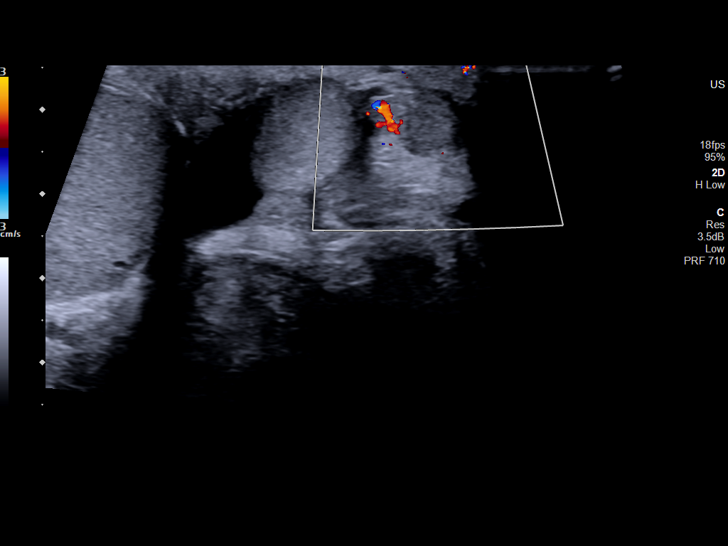

[14 of 25 positions shown; findings below may reference images not displayed]

FINDINGS: Right testicle

Measurements: 5.5 x 2.3 x 3.6 cm. No mass or microlithiasis
visualized.

Left testicle

Measurements: 5.5 x 3 x 3.5 cm. No mass or microlithiasis
visualized.

Right epididymis:  Normal in size and appearance.

Left epididymis:  Normal in size and appearance.

Hydrocele:  There are small bilateral hydroceles.

Varicocele:  None visualized.

Pulsed Doppler interrogation of both testes demonstrates normal low
resistance arterial and venous waveforms bilaterally.
IMPRESSION: 1. No evidence for testicular torsion.
2. No testicular mass.
3. There are small bilateral hydroceles.

## 2022-04-03 IMAGING — MR MR LUMBAR SPINE W/O CM
5 series · 31 of 48 positions shown · non-contrast
Comparison: 03/22/2018.

CLINICAL DATA: Chronic lower back pain.

EXAM:
MRI LUMBAR SPINE WITHOUT CONTRAST
TECHNIQUE: Multiplanar, multisequence MR imaging of the lumbar spine was
performed. No intravenous contrast was administered.

[Series 5: T2 · sagittal · 4.0mm · 0.81mm/px · 6 of 17 slices shown (1 of 2)]
[im 1/17]
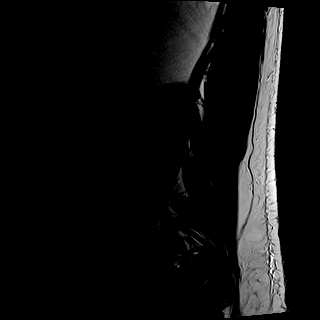
[im 4/17]
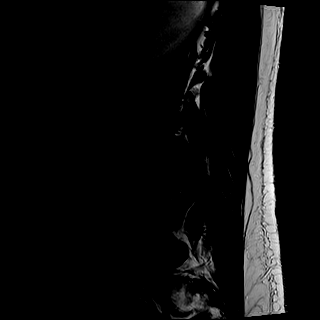
[im 7/17]
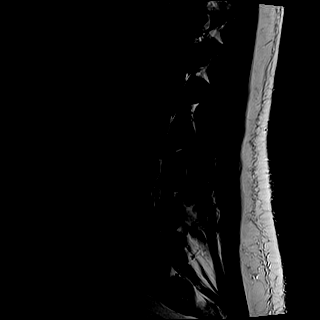
[im 10/17]
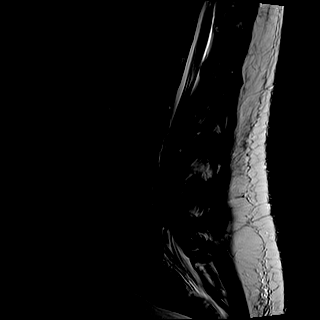
[im 13/17]
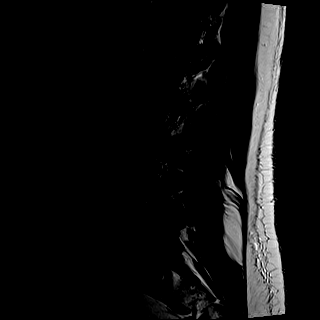
[im 17/17]
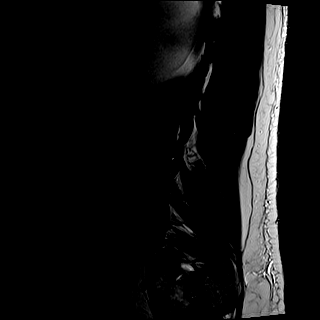

[Series 6: T1 · sagittal · 4.0mm · 0.81mm/px · 7 of 17 slices shown (1 of 2)]
[im 1/17]
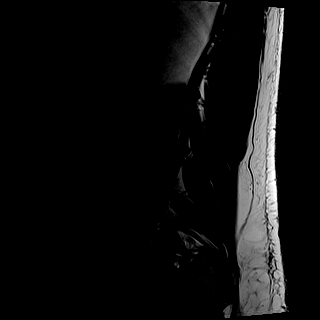
[im 3/17]
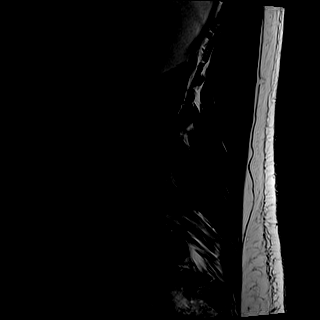
[im 6/17]
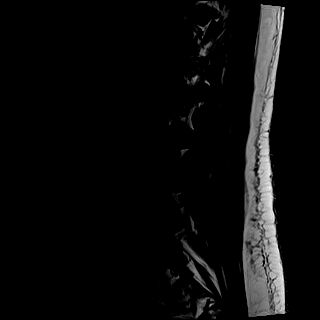
[im 9/17]
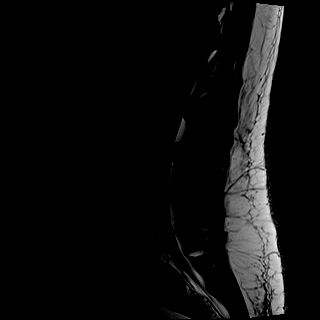
[im 11/17]
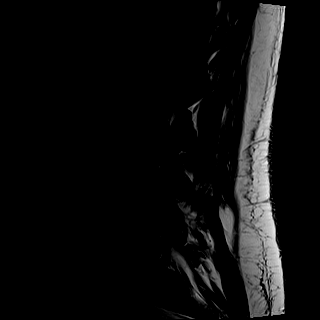
[im 14/17]
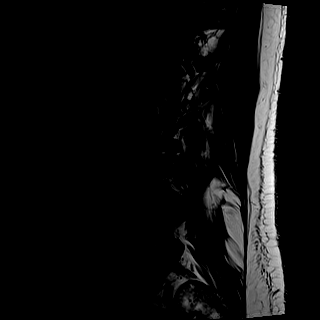
[im 17/17]
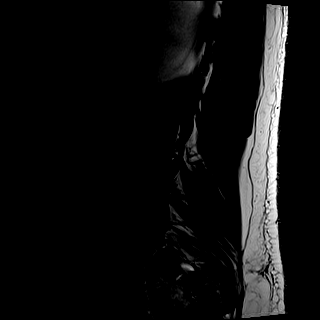

[Series 7: STIR · sagittal · 4.0mm · 0.41mm/px · 2 of 17 slices shown]
[im 1/17]
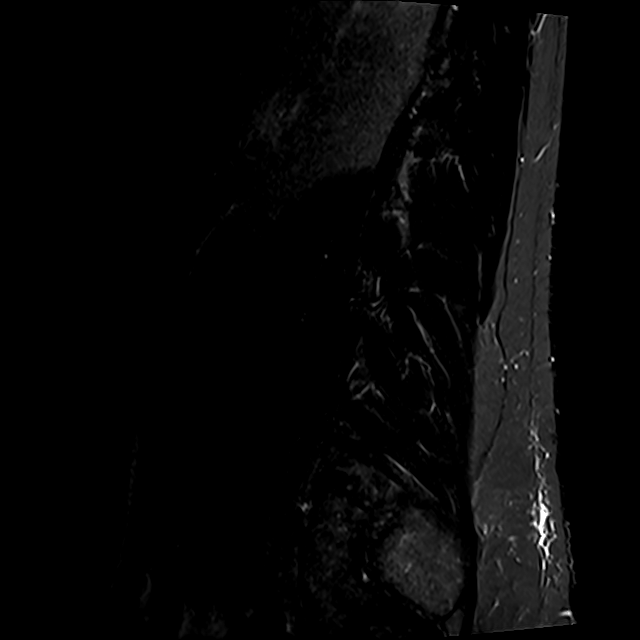
[im 3/17]
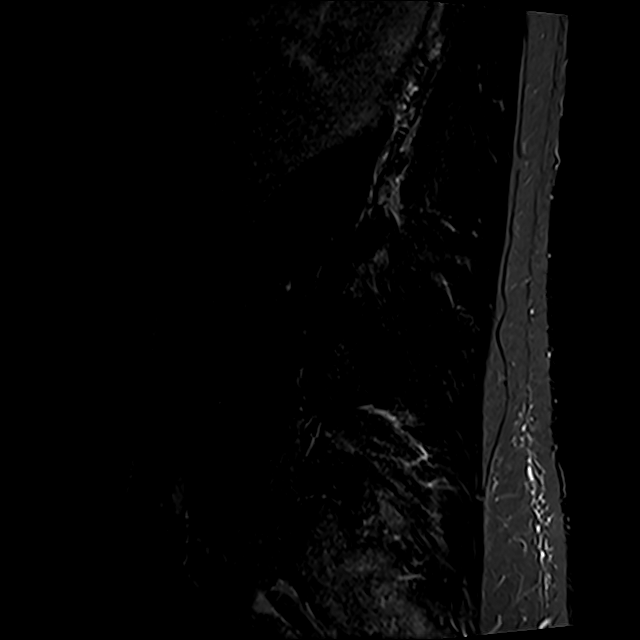

[Series 8: T2 · axial · 4.0mm · 0.78mm/px · z∈[-28,+196]mm · 8 of 34 slices shown (2 of 2)]
[im 1/34]
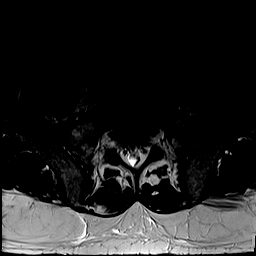
[im 6/34]
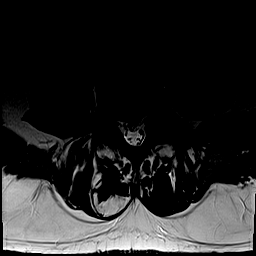
[im 11/34]
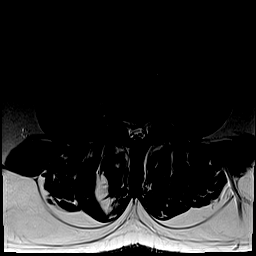
[im 16/34]
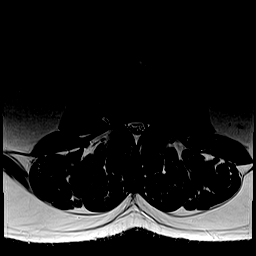
[im 18/34]
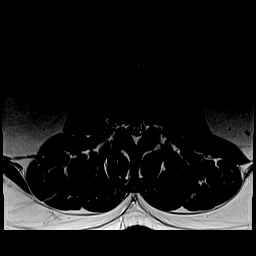
[im 23/34]
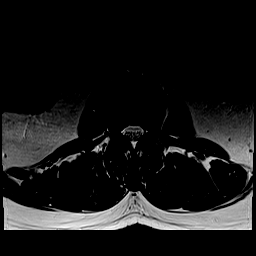
[im 28/34]
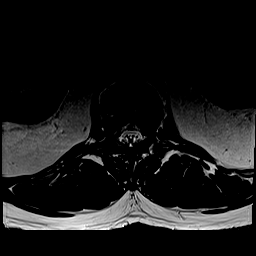
[im 34/34]
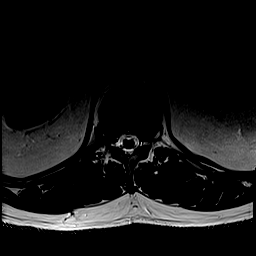

[Series 9: T1 · axial · 4.0mm · 0.39mm/px · z∈[-28,+196]mm · 8 of 34 slices shown (2 of 2)]
[im 1/34]
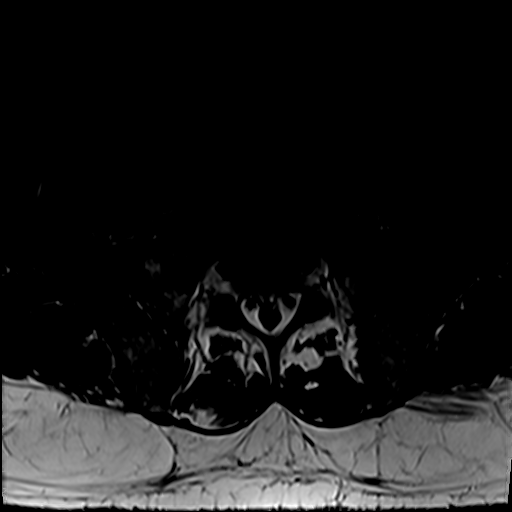
[im 6/34]
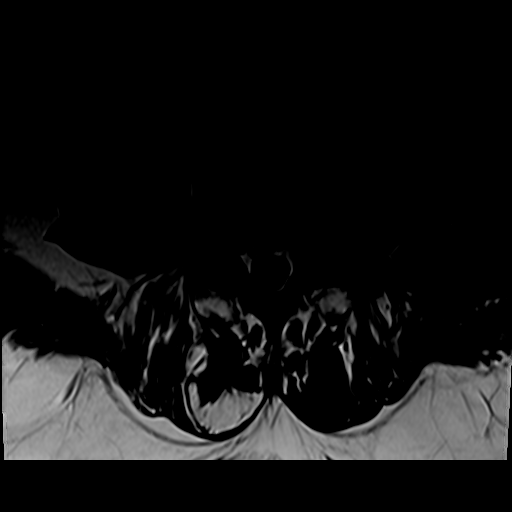
[im 11/34]
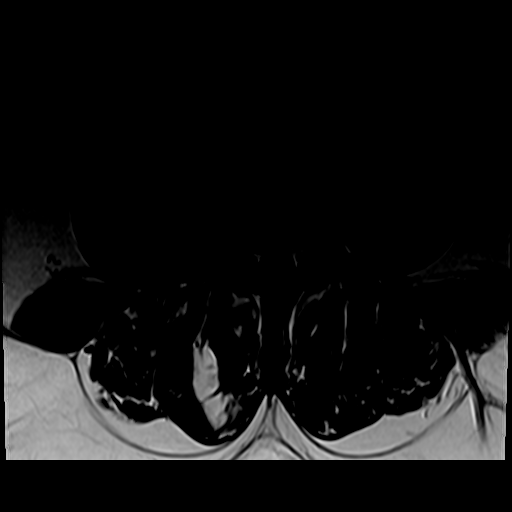
[im 16/34]
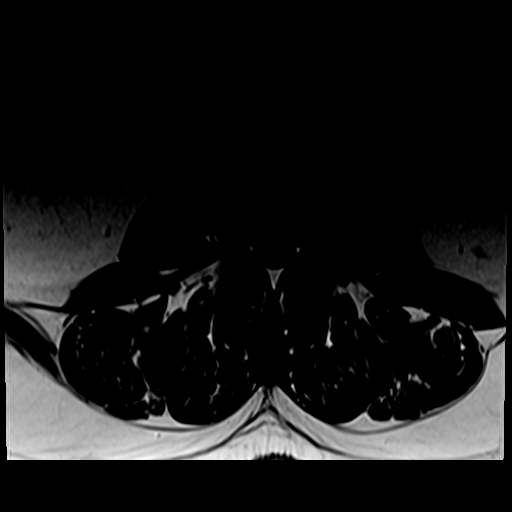
[im 18/34]
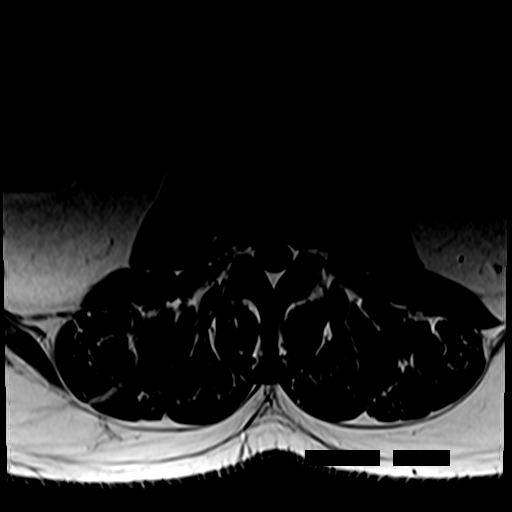
[im 23/34]
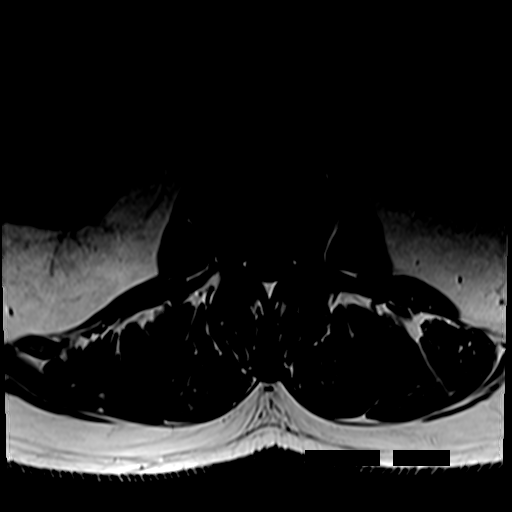
[im 28/34]
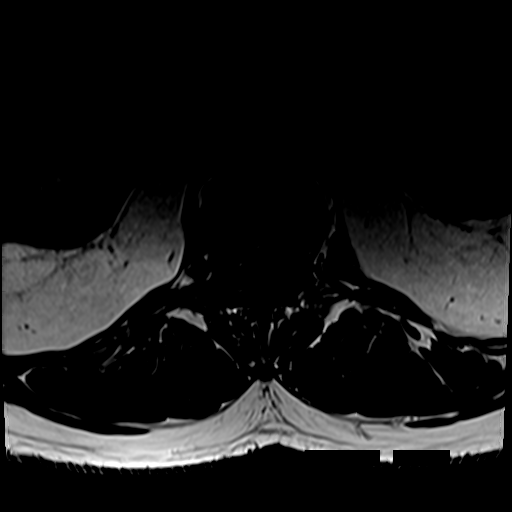
[im 34/34]
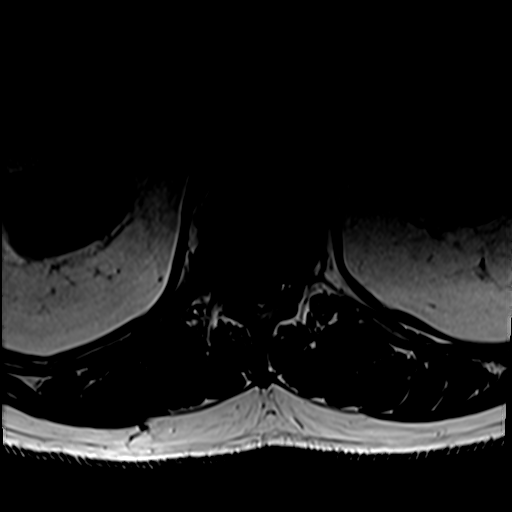

[31 of 48 positions shown; findings below may reference images not displayed]

FINDINGS: Segmentation:  Standard.

Alignment: Physiologic. Congenital spinal canal narrowing with
prominent dorsal epidural fat.

Vertebrae: Vertebral body heights are preserved. No aggressive
osseous lesion. Minimal Modic type 2 endplate degenerative changes.

Conus medullaris and cauda equina: Conus extends to the L1 level.
Conus and cauda equina appear normal.

Disc levels: Multilevel desiccation.

L1-2: No significant disc bulge. Facet degenerative spurring. Patent
spinal canal and neural foramen.

L2-3: Minimal disc bulge with left foraminal protrusion. Facet
degenerative spurring. Patent spinal canal and right neural foramen.
Mild left neural foraminal narrowing.

L3-4: Minimal disc bulge with shallow central protrusion and facet
degenerative spurring. Patent spinal canal. Mild bilateral neural
foraminal narrowing.

L4-5: Disc bulge with inferiorly migrated right paracentral
extrusion abutting the descending L5 nerve root. Facet degenerative
spurring. Mild to moderate spinal canal and bilateral neural
foraminal narrowing.

L5-S1: Disc bulge with superimposed central and left subarticular
protrusions. There is abutment of the descending left S1 nerve root.
Facet degenerative spurring. Patent spinal canal. Moderate left and
mild right neural foraminal narrowing.

Paraspinal and other soft tissues: Negative.
IMPRESSION: Congenital spinal canal narrowing with superimposed spondylosis.

Inferiorly migrated right L4-5 paracentral extrusion abutting the
descending L5 nerve root. Mild to moderate spinal canal/neural
foraminal narrowing at this level.

Mild left L2-3 and bilateral L3-4 neural foraminal narrowing.

L5-S1 central and left subarticular protrusions with moderate left
and mild right neural foraminal narrowing.
